# Patient Record
Sex: Male | Born: 1975
Health system: Southern US, Community
[De-identification: ages and names within clinical notes are randomized; demographics above are authoritative.]

## PROBLEM LIST (undated history)

## (undated) DIAGNOSIS — J3089 Other allergic rhinitis: Secondary | ICD-10-CM

## (undated) DIAGNOSIS — I10 Essential (primary) hypertension: Secondary | ICD-10-CM

## (undated) DIAGNOSIS — F32A Depression, unspecified: Secondary | ICD-10-CM

## (undated) DIAGNOSIS — F419 Anxiety disorder, unspecified: Secondary | ICD-10-CM

## (undated) HISTORY — PX: VASECTOMY: SHX75

---

## 2009-11-25 ENCOUNTER — Ambulatory Visit: Payer: Self-pay | Admitting: Gastroenterology

## 2013-10-10 ENCOUNTER — Ambulatory Visit: Payer: Self-pay | Admitting: General Practice

## 2013-11-13 ENCOUNTER — Ambulatory Visit: Payer: Self-pay | Admitting: Urology

## 2015-04-15 IMAGING — CT CT ABD-PELV W/ CM
2 of 7 series · 14 of 46 positions shown, 19 images · IV contrast (isovue)
Comparison: 10/10/2013

CLINICAL DATA: Gross hematuria.  No other symptoms.

EXAM:
CT ABDOMEN AND PELVIS WITH CONTRAST
TECHNIQUE: Multidetector CT imaging of the abdomen and pelvis was performed
using the standard protocol following bolus administration of
intravenous contrast.
CONTRAST:  125 cc Isovue 370

[Series 2: axial soft tissue · axial · 0.79mm/px · z∈[-967,-587]mm · 11 of 90 slices shown, 16 images]
[im 7/90  soft-tissue]
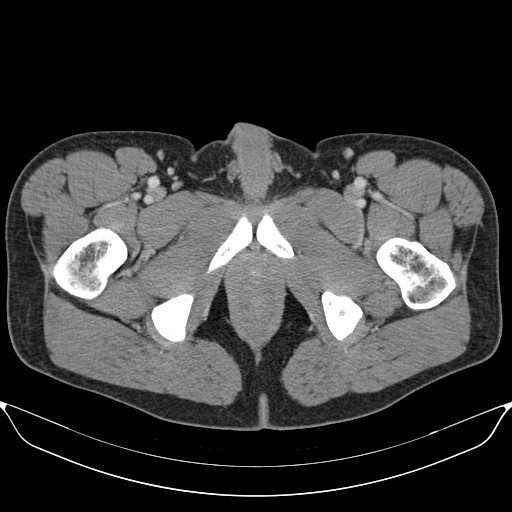
[im 7/90  bone]
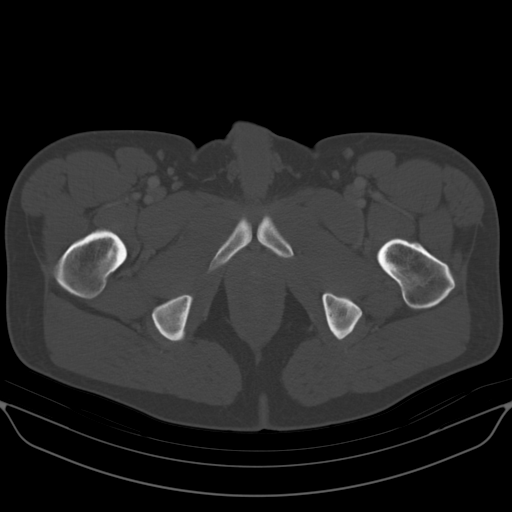
[im 13/90  soft-tissue]
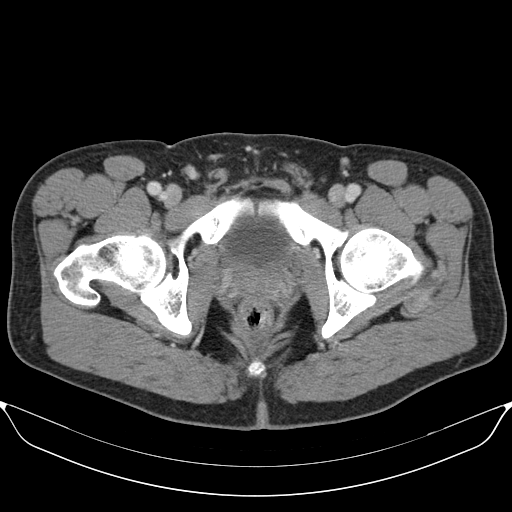
[im 26/90  soft-tissue]
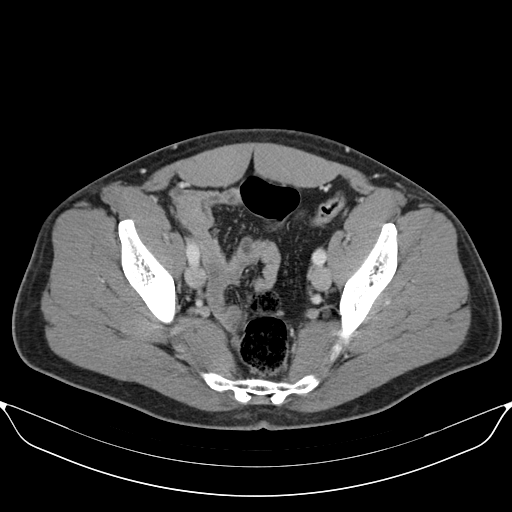
[im 32/90  soft-tissue]
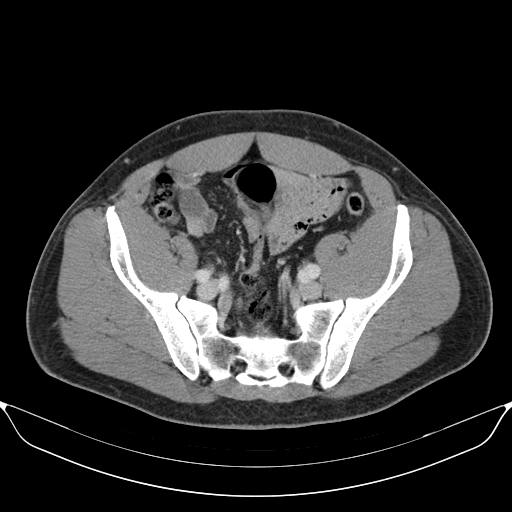
[im 39/90  soft-tissue]
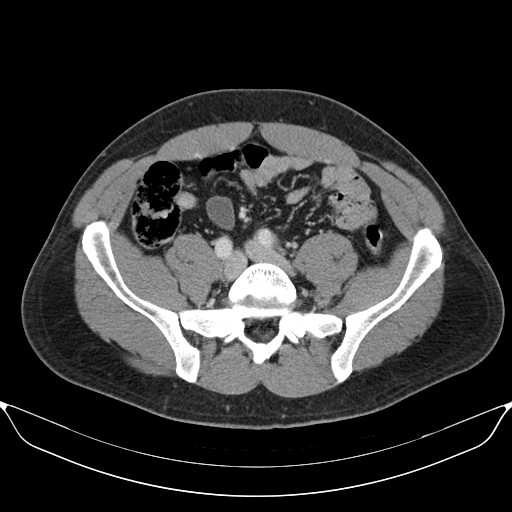
[im 51/90  soft-tissue]
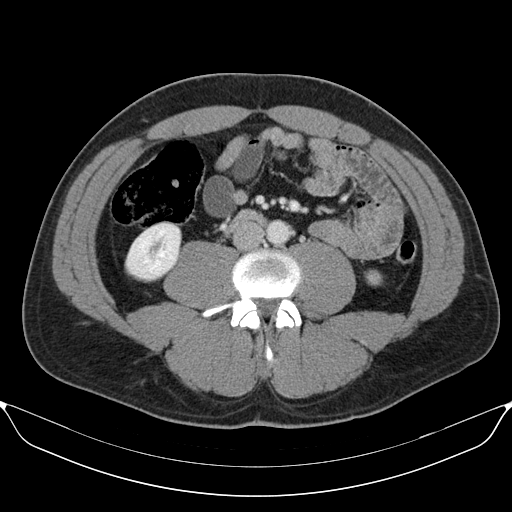
[im 58/90  soft-tissue]
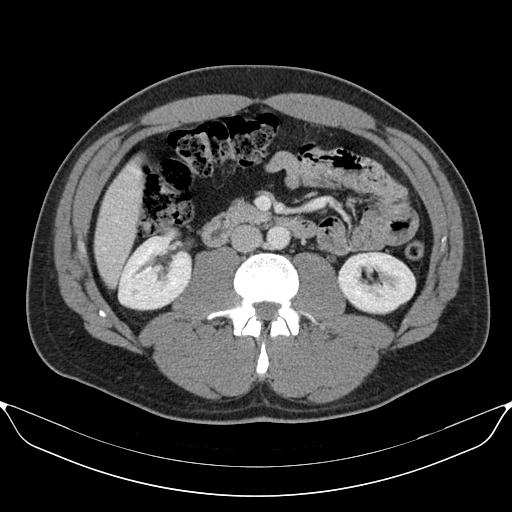
[im 64/90  soft-tissue]
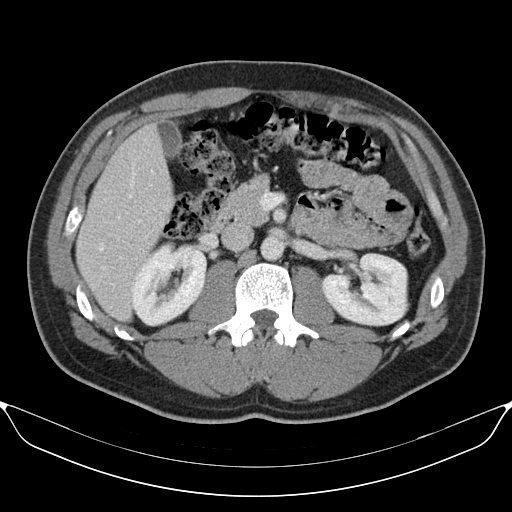
[im 64/90  lung]
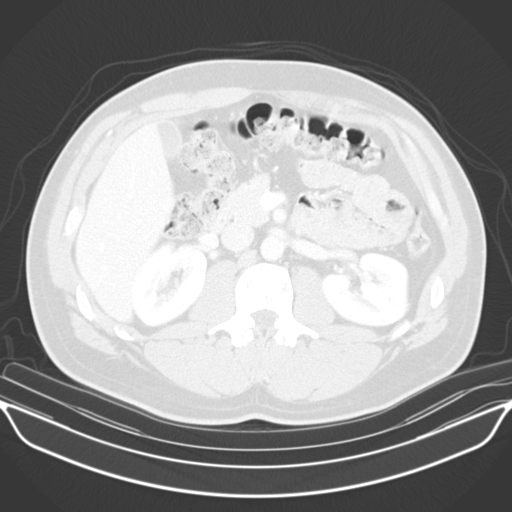
[im 70/90  lung]
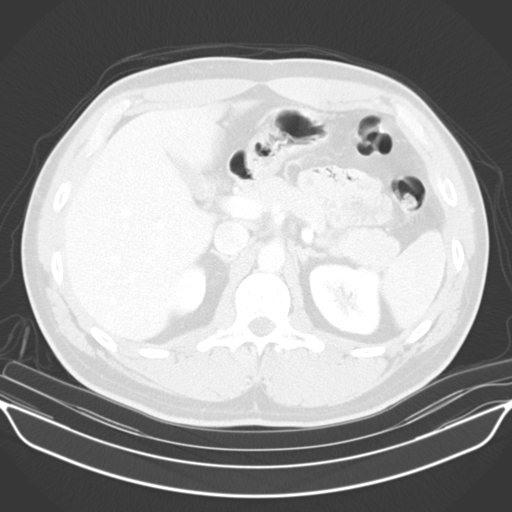
[im 77/90  soft-tissue]
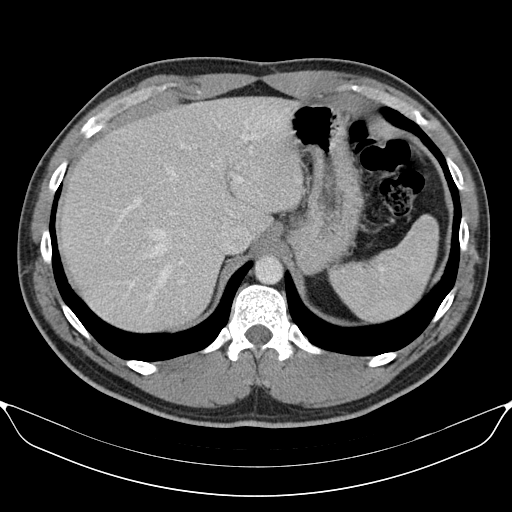
[im 77/90  lung]
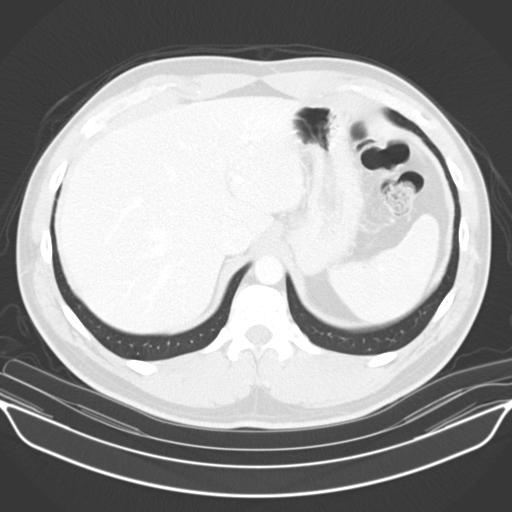
[im 77/90  bone]
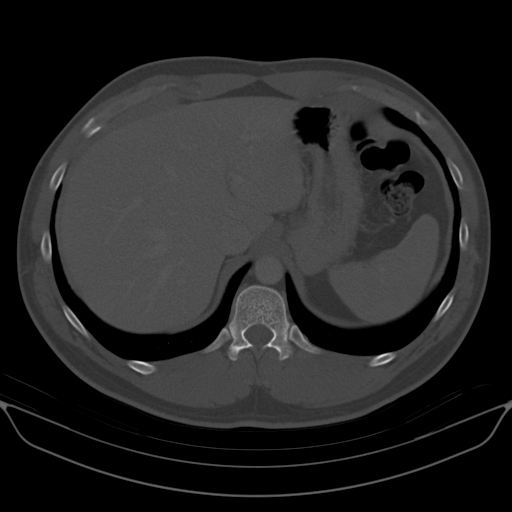
[im 83/90  soft-tissue]
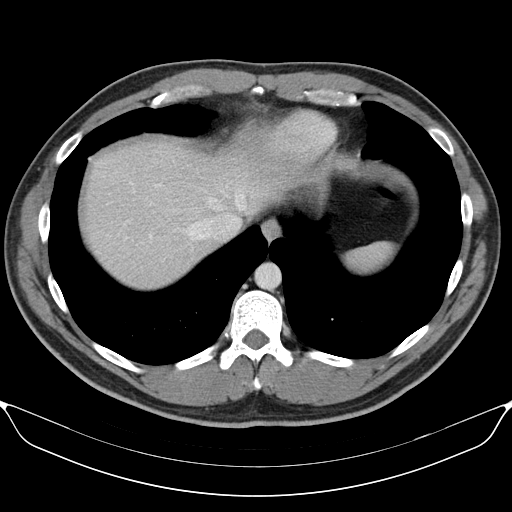
[im 83/90  lung]
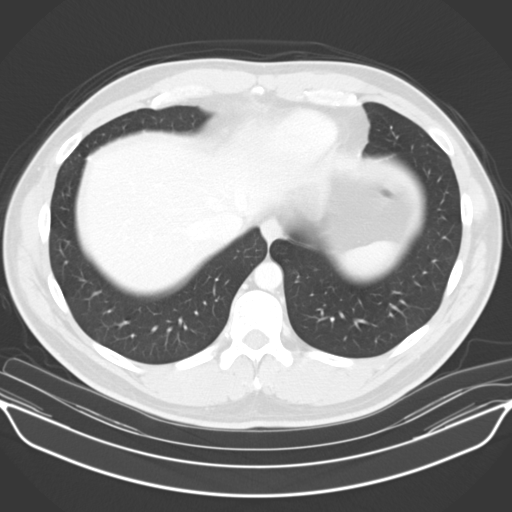

[Series 602: coronal · coronal · 0.87mm/px · 3 of 134 slices shown]
[im 34/134  soft-tissue]
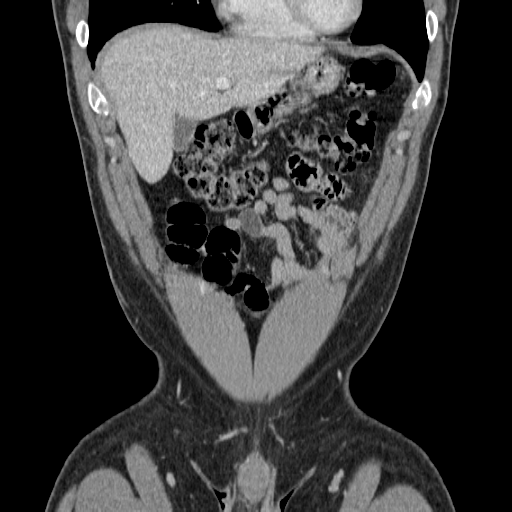
[im 67/134  soft-tissue]
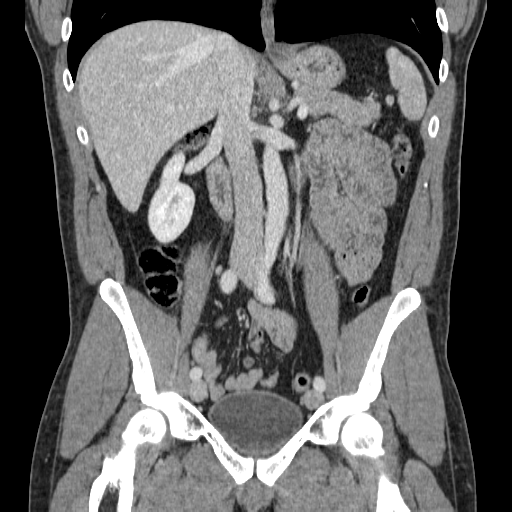
[im 100/134  soft-tissue]
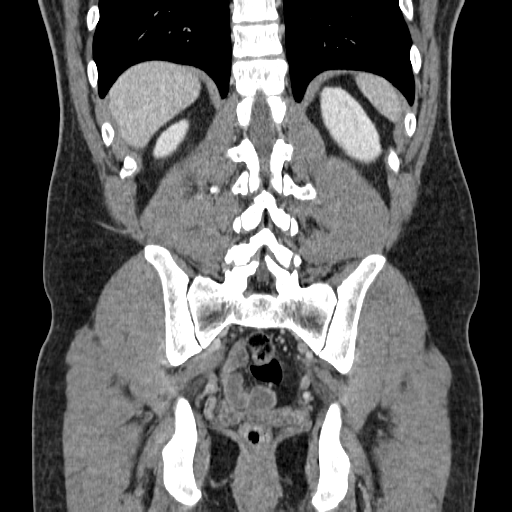

[14 of 46 positions shown; findings below may reference images not displayed]

FINDINGS: Lung Bases: Calcified granuloma at the right lung base. Normal heart
size without pericardial or pleural effusion.

Kidneys/Ureters/Bladder: No urinary tract calculi on last month's
stone study. No suspicious renal mass on post-contrast images. Renal
collecting systems moderately well opacified on delayed images.
Incomplete opacification of the distal most portion of the right
ureter. No filling defect identified.

No filling defect within moderately well opacified urinary bladder
on delayed images. No enhancing bladder mass on portal venous phase
images.

Other: Normal liver. Splenule. Normal stomach, pancreas,
gallbladder, biliary tract, adrenal glands. No retroperitoneal or
retrocrural adenopathy. Normal colon, appendix, and terminal ileum.
Normal small bowel without abdominal ascites. Early but age advanced
right internal iliac artery atherosclerosis on image 67/series 2 and
left internal iliac artery atherosclerosis on image 69/series 2. No
pelvic adenopathy. Normal prostate, without significant free pelvic
fluid. No significant free fluid.

Bones/Musculoskeletal: Suspect congenitally short lumbar pedicles.
No acute osseous abnormality.
IMPRESSION: 1.  No acute process or explanation for hematuria.
2. No unenhanced images performed. No evidence of urinary tract
calculi on the 10/10/2013 stone study.
3. Age advanced pelvic atherosclerosis.

## 2016-10-06 NOTE — Discharge Instructions (Signed)
[facility] °[facility] °ENDOSCOPIC SINUS SURGERY °[facility] ° °What is Functional Endoscopic Sinus Surgery? ° The Surgery involves making the natural openings of the sinuses larger by removing the bony partitions that separate the sinuses from the nasal cavity.  The natural sinus lining is preserved as much as possible to allow the sinuses to resume normal function after the surgery.  In some patients nasal polyps (excessively swollen lining of the sinuses) may be removed to relieve obstruction of the sinus openings.  The surgery is performed through the nose using lighted scopes, which eliminates the need for incisions on the face.  A septoplasty is a different procedure which is sometimes performed with sinus surgery.  It involves straightening the boy partition that separates the two sides of your nose.  A crooked or deviated septum may need repair if is obstructing the sinuses or nasal airflow.  Turbinate reduction is also often performed during sinus surgery.  The turbinates are bony proturberances from the side walls of the nose which swell and can obstruct the nose in patients with sinus and allergy problems.  Their size can be surgically reduced to help relieve nasal obstruction. ° °What Can Sinus Surgery Do For Me? ° Sinus surgery can reduce the frequency of sinus infections requiring antibiotic treatment.  This can provide improvement in nasal congestion, post-nasal drainage, facial pressure and nasal obstruction.  Surgery will NOT prevent you from ever having an infection again, so it usually only for patients who get infections 4 or more times yearly requiring antibiotics, or for infections that do not clear with antibiotics.  It will not cure nasal allergies, so patients with allergies may still require medication to treat their allergies after surgery. Surgery may improve headaches related to sinusitis, however, some people will continue to  require medication to control sinus headaches related to allergies.  Surgery will do nothing for other forms of headache (migraine, tension or cluster). ° °What Are the Risks of Endoscopic Sinus Surgery? ° Current techniques allow surgery to be performed safely with little risk, however, there are rare complications that patients should be aware of.  Because the sinuses are located around the eyes, there is risk of eye injury, including blindness, though again, this would be quite rare. This is usually a result of bleeding behind the eye during surgery, which puts the vision oat risk, though there are treatments to protect the vision and prevent permanent disrupted by surgery causing a leak of the spinal fluid that surrounds the brain.  More serious complications would include bleeding inside the brain cavity or damage to the brain.  Again, all of these complications are uncommon, and spinal fluid leaks can be safely managed surgically if they occur.  The most common complication of sinus surgery is bleeding from the nose, which may require packing or cauterization of the nose.  Continued sinus have polyps may experience recurrence of the polyps requiring revision surgery.  Alterations of sense of smell or injury to the tear ducts are also rare complications.  ° °What is the Surgery Like, and what is the Recovery? ° The Surgery usually takes a couple of hours to perform, and is usually performed under a general anesthetic (completely asleep).  Patients are usually discharged home after a couple of hours.  Sometimes during surgery it is necessary to pack the nose to control bleeding, and the packing is left in place for 24 - 48 hours, and removed by your surgeon.    If a septoplasty was performed during the procedure, there is often a splint placed which must be removed after 5-7 days.   °Discomfort: Pain is usually mild to moderate, and can be controlled by prescription pain medication or acetaminophen (Tylenol).   Aspirin, Ibuprofen (Advil, Motrin), or Naprosyn (Aleve) should be avoided, as they can cause increased bleeding.  Most patients feel sinus pressure like they have a bad head cold for several days.  Sleeping with your head elevated can help reduce swelling and facial pressure, as can ice packs over the face.  A humidifier may be helpful to keep the mucous and blood from drying in the nose.  ° °Diet: There are no specific diet restrictions, however, you should generally start with clear liquids and a light diet of bland foods because the anesthetic can cause some nausea.  Advance your diet depending on how your stomach feels.  Taking your pain medication with food will often help reduce stomach upset which pain medications can cause. ° °Nasal Saline Irrigation: It is important to remove blood clots and dried mucous from the nose as it is healing.  This is done by having you irrigate the nose at least 3 - 4 times daily with a salt water solution.  We recommend using NeilMed Sinus Rinse (available at the drug store).  Fill the squeeze bottle with the solution, bend over a sink, and insert the tip of the squeeze bottle into the nose ½ of an inch.  Point the tip of the squeeze bottle towards the inside corner of the eye on the same side your irrigating.  Squeeze the bottle and gently irrigate the nose.  If you bend forward as you do this, most of the fluid will flow back out of the nose, instead of down your throat.   The solution should be warm, near body temperature, when you irrigate.   Each time you irrigate, you should use a full squeeze bottle.  ° °Note that if you are instructed to use Nasal Steroid Sprays at any time after your surgery, irrigate with saline BEFORE using the steroid spray, so you do not wash it all out of the nose. °Another product, Nasal Saline Gel (such as AYR Nasal Saline Gel) can be applied in each nostril 3 - 4 times daily to moisture the nose and reduce scabbing or crusting. ° °Bleeding:   Bloody drainage from the nose can be expected for several days, and patients are instructed to irrigate their nose frequently with salt water to help remove mucous and blood clots.  The drainage may be dark red or brown, though some fresh blood may be seen intermittently, especially after irrigation.  Do not blow you nose, as bleeding may occur. If you must sneeze, keep your mouth open to allow air to escape through your mouth. ° °If heavy bleeding occurs: Irrigate the nose with saline to rinse out clots, then spray the nose 3 - 4 times with Afrin Nasal Decongestant Spray.  The spray will constrict the blood vessels to slow bleeding.  Pinch the lower half of your nose shut to apply pressure, and lay down with your head elevated.  Ice packs over the nose may help as well. If bleeding persists despite these measures, you should notify your doctor.  Do not use the Afrin routinely to control nasal congestion after surgery, as it can result in worsening congestion and may affect healing.  ° °Activity: Return to work varies among patients. Most patients will be out of   work at least 5 - 7 days to recover.  Patient may return to work after they are off of narcotic pain medication, and feeling well enough to perform the functions of their job.  Patients must avoid heavy lifting (over 10 pounds) or strenuous physical for 2 weeks after surgery, so your employer may need to assign you to light duty, or keep you out of work longer if light duty is not possible.  NOTE: you should not drive, operate dangerous machinery, do any mentally demanding tasks or make any important legal or financial decisions while on narcotic pain medication and recovering from the general anesthetic.  °  °Call Your Doctor Immediately if You Have Any of the Following: °1. Bleeding that you cannot control with the above measures °2. Loss of vision, double vision, bulging of the eye or black eyes. °3. Fever over 101 degrees °4. Neck stiffness with severe  headache, fever, nausea and change in mental state. °You are always encourage to call anytime with concerns, however, please call with requests for pain medication refills during office hours. ° °Office Endoscopy: During follow-up visits your doctor will remove any packing or splints that may have been placed and evaluate and clean your sinuses endoscopically.  Topical anesthetic will be used to make this as comfortable as possible, though you may want to take your pain medication prior to the visit.  How often this will need to be done varies from patient to patient.  After complete recovery from the surgery, you may need follow-up endoscopy from time to time, particularly if there is concern of recurrent infection or nasal polyps. ° ° °General Anesthesia, Adult, Care After °These instructions provide you with information about caring for yourself after your procedure. Your health care provider may also give you more specific instructions. Your treatment has been planned according to current medical practices, but problems sometimes occur. Call your health care provider if you have any problems or questions after your procedure. °What can I expect after the procedure? °After the procedure, it is common to have: °· Vomiting. °· A sore throat. °· Mental slowness. °It is common to feel: °· Nauseous. °· Cold or shivery. °· Sleepy. °· Tired. °· Sore or achy, even in parts of your body where you did not have surgery. °Follow these instructions at home: °For at least 24 hours after the procedure: °· Do not: °¨ Participate in activities where you could fall or become injured. °¨ Drive. °¨ Use heavy machinery. °¨ Drink alcohol. °¨ Take sleeping pills or medicines that cause drowsiness. °¨ Make important decisions or sign legal documents. °¨ Take care of children on your own. °· Rest. °Eating and drinking °· If you vomit, drink water, juice, or soup when you can drink without vomiting. °· Drink enough fluid to keep your  urine clear or pale yellow. °· Make sure you have little or no nausea before eating solid foods. °· Follow the diet recommended by your health care provider. °General instructions °· Have a responsible adult stay with you until you are awake and alert. °· Return to your normal activities as told by your health care provider. Ask your health care provider what activities are safe for you. °· Take over-the-counter and prescription medicines only as told by your health care provider. °· If you smoke, do not smoke without supervision. °· Keep all follow-up visits as told by your health care provider. This is important. °Contact a health care provider if: °· You continue to have nausea   or vomiting at home, and medicines are not helpful. °· You cannot drink fluids or start eating again. °· You cannot urinate after 8-12 hours. °· You develop a skin rash. °· You have fever. °· You have increasing redness at the site of your procedure. °Get help right away if: °· You have difficulty breathing. °· You have chest pain. °· You have unexpected bleeding. °· You feel that you are having a life-threatening or urgent problem. °This information is not intended to replace advice given to you by your health care provider. Make sure you discuss any questions you have with your health care provider. °Document Released: 01/30/2001 Document Revised: 03/28/2016 Document Reviewed: 10/08/2015 °Elsevier Interactive Patient Education © 2017 [employer] ° °

## 2016-10-13 ENCOUNTER — Ambulatory Visit
Admission: RE | Admit: 2016-10-13 | Discharge: 2016-10-13 | Disposition: A | Discharge status: Home / Self Care | Payer: Commercial Managed Care - HMO | Source: Ambulatory Visit | Attending: Otolaryngology | Admitting: Otolaryngology

## 2016-10-13 ENCOUNTER — Ambulatory Visit: Payer: Commercial Managed Care - HMO | Admitting: Anesthesiology

## 2016-10-13 ENCOUNTER — Encounter
Admission: RE | Disposition: A | Discharge status: Home / Self Care | Payer: Self-pay | Source: Ambulatory Visit | Attending: Otolaryngology

## 2016-10-13 DIAGNOSIS — J343 Hypertrophy of nasal turbinates: Secondary | ICD-10-CM | POA: Insufficient documentation

## 2016-10-13 DIAGNOSIS — I10 Essential (primary) hypertension: Secondary | ICD-10-CM | POA: Diagnosis not present

## 2016-10-13 DIAGNOSIS — J342 Deviated nasal septum: Secondary | ICD-10-CM | POA: Diagnosis not present

## 2016-10-13 HISTORY — PX: SEPTOPLASTY: SHX2393

## 2016-10-13 HISTORY — DX: Essential (primary) hypertension: I10

## 2016-10-13 HISTORY — PX: TURBINATE REDUCTION: SHX6157

## 2016-10-13 SURGERY — SEPTOPLASTY, NOSE
Anesthesia: General | Site: Nose | Laterality: Bilateral | Wound class: Clean Contaminated

## 2016-10-13 MED ORDER — OXYCODONE HCL 5 MG/5ML PO SOLN: 5.0000 mg | Freq: Once | ORAL | Status: AC | PRN

## 2016-10-13 MED ORDER — LACTATED RINGERS IV SOLN
INTRAVENOUS | Status: DC
  Administered 2016-10-13: 10:00:00 via INTRAVENOUS

## 2016-10-13 MED ORDER — FENTANYL CITRATE (PF) 100 MCG/2ML IJ SOLN
INTRAMUSCULAR | Status: DC | PRN
  Administered 2016-10-13: 100 ug via INTRAVENOUS

## 2016-10-13 MED ORDER — LIDOCAINE HCL (CARDIAC) 20 MG/ML IV SOLN
INTRAVENOUS | Status: DC | PRN
  Administered 2016-10-13: 50 mg via INTRAVENOUS

## 2016-10-13 MED ORDER — ALBUTEROL SULFATE HFA 108 (90 BASE) MCG/ACT IN AERS
INHALATION_SPRAY | RESPIRATORY_TRACT | Status: DC | PRN
  Administered 2016-10-13 (×3): 2 via RESPIRATORY_TRACT

## 2016-10-13 MED ORDER — LIDOCAINE-EPINEPHRINE 1 %-1:100000 IJ SOLN
INTRAMUSCULAR | Status: DC | PRN
  Administered 2016-10-13: 6 mL

## 2016-10-13 MED ORDER — SCOPOLAMINE 1 MG/3DAYS TD PT72
1.0000 | MEDICATED_PATCH | Freq: Once | TRANSDERMAL | Status: DC
  Administered 2016-10-13: 1.5 mg via TRANSDERMAL

## 2016-10-13 MED ORDER — ONDANSETRON HCL 4 MG/2ML IJ SOLN: 4.0000 mg | Freq: Once | INTRAMUSCULAR | Status: DC | PRN

## 2016-10-13 MED ORDER — CEFAZOLIN SODIUM-DEXTROSE 2-4 GM/100ML-% IV SOLN
2.0000 g | Freq: Once | INTRAVENOUS | Status: AC
  Administered 2016-10-13: 2 g via INTRAVENOUS

## 2016-10-13 MED ORDER — FENTANYL CITRATE (PF) 100 MCG/2ML IJ SOLN
25.0000 ug | INTRAMUSCULAR | Status: AC | PRN
  Administered 2016-10-13: 50 ug via INTRAVENOUS
  Administered 2016-10-13 (×2): 25 ug via INTRAVENOUS
  Administered 2016-10-13: 50 ug via INTRAVENOUS

## 2016-10-13 MED ORDER — ONDANSETRON HCL 4 MG/2ML IJ SOLN
INTRAMUSCULAR | Status: DC | PRN
  Administered 2016-10-13: 4 mg via INTRAVENOUS

## 2016-10-13 MED ORDER — SUCCINYLCHOLINE CHLORIDE 20 MG/ML IJ SOLN
INTRAMUSCULAR | Status: DC | PRN
  Administered 2016-10-13: 100 mg via INTRAVENOUS

## 2016-10-13 MED ORDER — ACETAMINOPHEN 10 MG/ML IV SOLN
1000.0000 mg | Freq: Once | INTRAVENOUS | Status: AC
  Administered 2016-10-13: 1000 mg via INTRAVENOUS

## 2016-10-13 MED ORDER — DEXAMETHASONE SODIUM PHOSPHATE 4 MG/ML IJ SOLN
INTRAMUSCULAR | Status: DC | PRN
  Administered 2016-10-13: 12 mg via INTRAVENOUS

## 2016-10-13 MED ORDER — PROPOFOL 10 MG/ML IV BOLUS
INTRAVENOUS | Status: DC | PRN
  Administered 2016-10-13: 200 mg via INTRAVENOUS

## 2016-10-13 MED ORDER — OXYMETAZOLINE HCL 0.05 % NA SOLN
2.0000 | Freq: Once | NASAL | Status: AC
  Administered 2016-10-13: 2 via NASAL

## 2016-10-13 MED ORDER — OXYCODONE HCL 5 MG PO TABS
5.0000 mg | ORAL_TABLET | Freq: Once | ORAL | Status: AC | PRN
  Administered 2016-10-13: 5 mg via ORAL

## 2016-10-13 MED ORDER — MIDAZOLAM HCL 5 MG/5ML IJ SOLN
INTRAMUSCULAR | Status: DC | PRN
  Administered 2016-10-13: 2 mg via INTRAVENOUS

## 2016-10-13 MED ORDER — ROCURONIUM BROMIDE 100 MG/10ML IV SOLN
INTRAVENOUS | Status: DC | PRN
  Administered 2016-10-13: 20 mg via INTRAVENOUS

## 2016-10-13 MED ORDER — PHENYLEPHRINE HCL 0.5 % NA SOLN
NASAL | Status: DC | PRN
  Administered 2016-10-13: 30 mL via TOPICAL

## 2016-10-13 SURGICAL SUPPLY — 22 items
CANISTER SUCT 1200ML W/VALVE (MISCELLANEOUS) ×3 IMPLANT
CATH IV 18X1 1/4 SAFELET (CATHETERS) ×3 IMPLANT
COAGULATOR SUCT 8FR VV (MISCELLANEOUS) ×3 IMPLANT
DRAPE HEAD BAR (DRAPES) ×3 IMPLANT
GLOVE PI ULTRA LF STRL 7.5 (GLOVE) ×3 IMPLANT
GLOVE PI ULTRA NON LATEX 7.5 (GLOVE) ×6
IV CATH 18X1 1/4 SAFELET (CATHETERS) ×1
KIT ROOM TURNOVER OR (KITS) ×3 IMPLANT
NEEDLE ANESTHESIA  27G X 3.5 (NEEDLE)
NEEDLE ANESTHESIA 27G X 3.5 (NEEDLE) IMPLANT
PACK DRAPE NASAL/ENT (PACKS) ×3 IMPLANT
PAD GROUND ADULT SPLIT (MISCELLANEOUS) ×3 IMPLANT
PATTIES SURGICAL .5 X3 (DISPOSABLE) ×3 IMPLANT
SPLINT NASAL SEPTAL BLV .50 ST (MISCELLANEOUS) ×3 IMPLANT
STRAP BODY AND KNEE 60X3 (MISCELLANEOUS) ×6 IMPLANT
SUT CHROMIC 3-0 (SUTURE) ×2
SUT CHROMIC 3-0 KS 27XMFL CR (SUTURE) ×1
SUT ETHILON 3-0 KS 30 BLK (SUTURE) ×3 IMPLANT
SUT PLAIN GUT 4-0 (SUTURE) ×3 IMPLANT
SUTURE CHRMC 3-0 KS 27XMFL CR (SUTURE) ×1 IMPLANT
SYR 3ML LL SCALE MARK (SYRINGE) ×3 IMPLANT
TOWEL OR 17X26 4PK STRL BLUE (TOWEL DISPOSABLE) ×3 IMPLANT

## 2016-10-13 NOTE — Transfer of Care (Signed)
Immediate Anesthesia Transfer of Care Note  Patient: Julian ArenasJason S Wessels  Procedure(s) Performed: Procedure(s): SEPTOPLASTY (Bilateral) BILATERAL INFERIOR TURBINATE REDUCTION AND RIGHT MIDDLE (Bilateral)  Patient Location: PACU  Anesthesia Type: General ETT  Level of Consciousness: awake, alert  and patient cooperative  Airway and Oxygen Therapy: Patient Spontanous Breathing and Patient connected to supplemental oxygen  Post-op Assessment: Post-op Vital signs reviewed, Patient's Cardiovascular Status Stable, Respiratory Function Stable, Patent Airway and No signs of Nausea or vomiting  Post-op Vital Signs: Reviewed and stable  Complications: No apparent anesthesia complications

## 2016-10-13 NOTE — Op Note (Signed)
10/13/2016  12:22 PM [id number]   Pre-Op Dx:  Deviated Nasal Septum, Hypertrophic Inferior Turbinates, hypertrophic right middle turbinate, lateralized left middle turbinate  Post-op Dx: Same  Proc: Nasal Septoplasty, Bilateral Partial Reduction Inferior Turbinates, endoscopic reduction of right middle turbinate   Surg:  Julian Murphy  Anes:  GOT  EBL:  100 mL  Comp:  None  Findings: The septum had a Z-shaped deformity with the ethmoid plate buckled to the left side and pushing the middle turbinate laterally. The septum had a spur inferiorly to the right side with cartilage overhanging here. The right middle turbinate was overgrown and the inferior turbinates were hypertrophied  Procedure: With the patient in a comfortable supine position,  general orotracheal anesthesia was induced without difficulty.     The patient received preoperative Afrin spray for topical decongestion and vasoconstriction.  Intravenous prophylactic antibiotics were administered.  At an appropriate level, the patient was placed in a semi-sitting position.  Nasal vibrissae were trimmed.   1% Xylocaine with 1:100,000 epinephrine, 6 cc's, was infiltrated into the anterior floor of the nose, into the nasal spine region, into the membranous columella, and finally into the submucoperichondrial plane of the septum on both sides.  Several minutes were allowed for this to take effect.  Cottoniod pledgetts soaked in Afrin and 4% Xylocaine were placed into both nasal cavities and left while the patient was prepped and draped in the standard fashion.  The materials were removed from the nose and observed to be intact and correct in number.  The nose was inspected with a headlight and a 0 scope with the findings as described above.  A left Killian incision was sharply executed and carried down to the quadrangular cartilage. The mucoperichondrium was elelvated along the quadrangular plate back to the bony-cartilaginous  junction. The mucoperiostium was then elevated along the ethmoid plate and the vomer. The boney-catilaginous junction was then split with a freer elevator and the mucoperiosteum was elevated on the opposite side. The mucoperiosteum was then elevated along the maxillary crest as needed to expose the crooked bone of the crest.  Boney spurs of the vomer and maxillary crest were removed with Lenoria Chimeakahashi forceps.  The cartilaginous plate was trimmed along its posterior and inferior borders of about 2 mm of cartilage to free it up inferiorly. Some of the deviated ethmoid plate was then fractured and removed with Takahashi forceps to free up the posterior border of the quadrangular plate and allow it to swing back to the midline. The mucosal flaps were placed back into their anatomic position to allow visualization of the airways. The septum now sat in the midline with an improved airway.  A 3-0 Chromic suture on a Keith needle in used to anchor the inferior septum at the nasal spine with a through and through suture. The mucosal flaps are then sutured together using a through and through whip stitch of 4-0 Plain Gut with a mini-Keith needle. This was used to close the DoughertyKillian incision as well.   The inferior turbinates were then inspected. An incision was created along the inferior aspect of the left inferior turbinate with removal of some of the inferior soft tissue and bone. Electrocautery was used to control bleeding in the area. The remaining turbinate was then outfractured to open up the airway further. There was no significant bleeding noted. The right turbinate was then trimmed and outfractured in a similar fashion.  The left middle turbinate was lateralized and had a very small middle meatus. It  was fractured medially and a piece of xerogel was placed into the middle meatus to help hold this opened and and in its normal medial position. The right middle turbinate was enlarged and 4 medialized pushing up  against the superior septum now that the septum was back in the middle. It was trimmed along its anterior inferior border with the Gruenwald forceps which removed some of this excessive turbinate. Electrocautery was used along its trimmed edge to help control bleeding. Xerogel was placed over the trimmed middle turbinate as a dressing.  The airways were then visualized and showed open passageways on both sides that were significantly improved compared to before surgery. There was no signifcant bleeding. Nasal splints were applied to both sides of the septum using Xomed 0.265mm regular sized splints that were trimmed, and then held in position with a 3-0 Nylon through and through suture.  The patient was turned back over to anesthesia, and awakened, extubated, and taken to the PACU in satisfactory condition.  Dispo:   PACU to home  Plan: Ice, elevation, narcotic analgesia, steroid taper, and prophylactic antibiotics for the duration of indwelling nasal foreign bodies.  We will reevaluate the patient in the office in 6 days and remove the septal splints.  Return to work in 10 days, strenuous activities in two weeks.   Julian Murphy 10/13/2016 12:22 PM

## 2016-10-13 NOTE — Anesthesia Preprocedure Evaluation (Signed)
Anesthesia Evaluation  Patient identified by MRN, date of birth, ID band Patient awake    Reviewed: Allergy & Precautions, H&P , NPO status , Patient's Chart, lab work & pertinent test results  Airway Mallampati: II  TM Distance: >3 FB Neck ROM: full    Dental no notable dental hx.    Pulmonary    Pulmonary exam normal        Cardiovascular hypertension, Normal cardiovascular exam     Neuro/Psych    GI/Hepatic   Endo/Other    Renal/GU      Musculoskeletal   Abdominal   Peds  Hematology   Anesthesia Other Findings   Reproductive/Obstetrics                             Anesthesia Physical Anesthesia Plan  ASA: II  Anesthesia Plan: General ETT   Post-op Pain Management:    Induction:   Airway Management Planned:   Additional Equipment:   Intra-op Plan:   Post-operative Plan:   Informed Consent: I have reviewed the patients History and Physical, chart, labs and discussed the procedure including the risks, benefits and alternatives for the proposed anesthesia with the patient or authorized representative who has indicated his/her understanding and acceptance.     Plan Discussed with:   Anesthesia Plan Comments:         Anesthesia Quick Evaluation

## 2016-10-13 NOTE — Anesthesia Procedure Notes (Signed)
Procedure Name: Intubation Date/Time: 10/13/2016 11:04 AM Performed by: Andee PolesBUSH, Olsen Mccutchan Pre-anesthesia Checklist: Patient identified, Emergency Drugs available, Suction available, Patient being monitored and Timeout performed Patient Re-evaluated:Patient Re-evaluated prior to inductionOxygen Delivery Method: Circle system utilized Preoxygenation: Pre-oxygenation with 100% oxygen Intubation Type: IV induction Ventilation: Mask ventilation without difficulty Laryngoscope Size: Mac and 4 Grade View: Grade II Tube type: Oral Rae Tube size: 8.0 mm Number of attempts: 1 Placement Confirmation: ETT inserted through vocal cords under direct vision,  positive ETCO2 and breath sounds checked- equal and bilateral Tube secured with: Tape Dental Injury: Teeth and Oropharynx as per pre-operative assessment

## 2016-10-13 NOTE — H&P (Signed)
  H&P has been reviewed and no changes necessary. To be downloaded later. 

## 2016-10-13 NOTE — Anesthesia Postprocedure Evaluation (Signed)
Anesthesia Post Note  Patient: Julian Murphy  Procedure(s) Performed: Procedure(s) (LRB): SEPTOPLASTY (Bilateral) BILATERAL INFERIOR TURBINATE REDUCTION AND RIGHT MIDDLE (Bilateral)  Patient location during evaluation: PACU Anesthesia Type: General Level of consciousness: awake and alert and oriented Pain management: satisfactory to patient Vital Signs Assessment: post-procedure vital signs reviewed and stable Respiratory status: spontaneous breathing, nonlabored ventilation and respiratory function stable Cardiovascular status: blood pressure returned to baseline and stable Postop Assessment: Adequate PO intake and No signs of nausea or vomiting Anesthetic complications: no    Cherly BeachStella, Justo Hengel J

## 2016-10-14 ENCOUNTER — Encounter: Payer: Self-pay | Admitting: Otolaryngology

## 2017-05-02 ENCOUNTER — Ambulatory Visit
Admission: EM | Admit: 2017-05-02 | Discharge: 2017-05-02 | Disposition: A | Discharge status: Home / Self Care | Payer: 59 | Attending: Emergency Medicine | Admitting: Emergency Medicine

## 2017-05-02 DIAGNOSIS — S30860A Insect bite (nonvenomous) of lower back and pelvis, initial encounter: Secondary | ICD-10-CM | POA: Diagnosis not present

## 2017-05-02 DIAGNOSIS — L0291 Cutaneous abscess, unspecified: Secondary | ICD-10-CM | POA: Diagnosis not present

## 2017-05-02 MED ORDER — SULFAMETHOXAZOLE-TRIMETHOPRIM 800-160 MG PO TABS: 1.0000 | ORAL_TABLET | Freq: Two times a day (BID) | ORAL | 0 refills | Status: AC

## 2017-05-02 NOTE — ED Triage Notes (Signed)
Patient complains of left back pain that occurred from possible spider bite. Area looks like an abscess. Patient states that this started last week.

## 2017-05-02 NOTE — ED Provider Notes (Signed)
[facility] ____________________________________________  Time seen: Approximately 10:43 AM  I have reviewed the triage vital signs and the nursing notes.   HISTORY  Chief Complaint Insect Bite   HPI Julian Murphy is a 41 y.o. male presenting for evaluation of insect bite that he believes occurred over this past week. Patient reports initially he had a small red spot on his left back that was itchy and look like a typical insect bite but has gradually worsened and is now tender. States mildly tender. Denies any drainage. Denies any headaches, joint pain, fevers, weakness, nausea, other complaints. Patient reports he believes he had a sebaceous cyst right side where he was bitten by the insect. Patient denies any other skin changes. Denies known tick bite or tick attachments. 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 Continues to eat and drink well. Denies other complaints. No aggravating or alleviating factors. Has not tried any over-the-counter medications. Denies other skin changes.  Denies chest pain, shortness of breath, abdominal pain, dysuria, extremity pain, extremity swelling.. Denies recent sickness. Denies recent antibiotic use.   PCP: [name] Reports tetanus immunization is up to date.     Past Medical History:  Diagnosis Date  . Hypertension    "under control now"    There are no active problems to display for this patient.   Past Surgical History:  Procedure Laterality Date  . SEPTOPLASTY Bilateral 10/13/2016   Procedure: SEPTOPLASTY;  Surgeon: Vernie MurdersPaul Juengel, MD;  [facility];  Service: ENT;  Laterality: Bilateral;  . TURBINATE REDUCTION Bilateral 10/13/2016   Procedure: BILATERAL INFERIOR TURBINATE REDUCTION AND RIGHT MIDDLE;  Surgeon: Vernie MurdersPaul Juengel, MD;  [facility];  Service: ENT;  Laterality: Bilateral;     No current facility-administered medications for this  encounter.   Current Outpatient Prescriptions:  .  buPROPion (WELLBUTRIN XL) 300 MG 24 hr tablet, Take 300 mg by mouth daily., Disp: , Rfl:  .  sulfamethoxazole-trimethoprim (BACTRIM DS,SEPTRA DS) 800-160 MG tablet, Take 1 tablet by mouth 2 (two) times daily., Disp: 20 tablet, Rfl: 0  Allergies Patient has no known allergies.  History reviewed. No pertinent family history.  Social History Social History  Substance Use Topics  . Smoking status: Never Smoker  . Smokeless tobacco: Never Used  . Alcohol use No    Review of Systems Constitutional: No fever/chills Eyes: No visual changes. ENT: No sore throat. Cardiovascular: Denies chest pain. Respiratory: Denies shortness of breath. Gastrointestinal: No abdominal pain.  No nausea, no vomiting.  No diarrhea.  No constipation. Genitourinary: Negative for dysuria. Musculoskeletal: Negative for back pain. Skin: as above.   ____________________________________________   PHYSICAL EXAM:  VITAL SIGNS: ED Triage Vitals  Enc Vitals Group     BP 05/02/17 1007 (!) 144/93     Pulse Rate 05/02/17 1007 (!) 53     Resp 05/02/17 1007 17     Temp 05/02/17 1007 98.4 F (36.9 C)     Temp Source 05/02/17 1007 Oral     SpO2 05/02/17 1007 100 %     Weight 05/02/17 1006 240 lb (108.9 kg)     Height 05/02/17 1006 6\' 1"  (1.854 m)     Head Circumference --      Peak Flow --      Pain Score 05/02/17 1006 8     Pain Loc --      Pain Edu? --      Excl. in  GC? --     Constitutional: Alert and oriented. Well appearing and in no acute distress. Cardiovascular: Normal rate, regular rhythm. Grossly normal heart sounds.  Good peripheral circulation. Respiratory: Normal respiratory effort without tachypnea nor retractions. Breath sounds are clear and equal bilaterally. No wheezes, rales, rhonchi. Gastrointestinal: Soft and nontender. Musculoskeletal:   No midline cervical, thoracic or lumbar tenderness to palpation.  Neurologic:  Normal speech and  language.  Speech is normal. No gait instability.  Skin:  Skin is warm, dry. Except: Left posterior back along her left shoulder blade area of approximately 3.5 cm x 2.5 cm mildly tender, erythematous and indurated area, no drainage, no fluctuance, no pointing abscess, no further surrounding erythema, no foreign body visualized, no clear punctum, no surrounding tenderness. No midline tenderness. Psychiatric: Mood and affect are normal. Speech and behavior are normal. Patient exhibits appropriate insight and judgment   ___________________________________________   LABS (all labs ordered are listed, but only abnormal results are displayed)  Labs Reviewed  ROCKY MTN SPOTTED FVR ABS PNL(IGG+IGM)  B. BURGDORFI ANTIBODIES    RADIOLOGY  No results found. ____________________________________________   PROCEDURES Procedures     INITIAL IMPRESSION / ASSESSMENT AND PLAN / ED COURSE  Pertinent labs & imaging results that were available during my care of the patient were reviewed by me and considered in my medical decision making (see chart for details).  Very well-appearing patient. No acute distress. Presents for evaluation of left posterior back abscess. Area indurated with local erythema, no fluctuance, no pointing no active drainage. Discussed with patient attempted I&D versus empiric antibiotics, patient declined I&D at this time. 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 Will start patient on oral Bactrim and encouraged wound care and warm compresses. Will evaluate RMSF and Lyme labs, deferred Doxy at this time as patient is outside a lot. Discussed with patient appears may be a infected sebaceous cyst and may need to follow-up with surgery for sac removal, information given. Patient states that he had been meaning to see a surgeon any ways about another sebaceous cyst. Discussed strict follow-up and return parameters including area gets larger, draining, new or worsening  concerns.Discussed indication, risks and benefits of medications with patient.  Discussed follow up with Primary care physician this week. Discussed follow up and return parameters including no resolution or any worsening concerns. Patient verbalized understanding and agreed to plan.   ____________________________________________   FINAL CLINICAL IMPRESSION(S) / ED DIAGNOSES  Final diagnoses:  Abscess     New Prescriptions   SULFAMETHOXAZOLE-TRIMETHOPRIM (BACTRIM DS,SEPTRA DS) 800-160 MG TABLET    Take 1 tablet by mouth 2 (two) times daily.    Note: This dictation was prepared with Dragon dictation along with smaller phrase technology. Any transcriptional errors that result from this process are unintentional.         Renford Dills, NP 05/02/17 1106    Renford Dills, NP 05/02/17 [phone]

## 2017-05-02 NOTE — Discharge Instructions (Signed)
Take medication as prescribed. Rest. Drink plenty of fluids. Keep clean. Apply warm compresses.   Follow up with your primary care physician or the above, this week as discussed. Return to Urgent care for new or worsening concerns.

## 2017-05-03 LAB — B. BURGDORFI ANTIBODIES

## 2017-05-04 LAB — ROCKY MTN SPOTTED FVR ABS PNL(IGG+IGM)
RMSF IGG: POSITIVE — AB
RMSF IGM: 0.44 {index} (ref 0.00–0.89)

## 2017-05-04 LAB — RMSF, IGG, IFA

## 2017-05-05 ENCOUNTER — Encounter: Payer: Self-pay | Admitting: Emergency Medicine

## 2017-05-05 ENCOUNTER — Telehealth: Payer: Self-pay | Admitting: Emergency Medicine

## 2017-05-05 ENCOUNTER — Ambulatory Visit
Admission: EM | Admit: 2017-05-05 | Discharge: 2017-05-05 | Disposition: A | Discharge status: Home / Self Care | Payer: 59 | Attending: Family Medicine | Admitting: Family Medicine

## 2017-05-05 DIAGNOSIS — W57XXXD Bitten or stung by nonvenomous insect and other nonvenomous arthropods, subsequent encounter: Secondary | ICD-10-CM | POA: Diagnosis not present

## 2017-05-05 DIAGNOSIS — L0390 Cellulitis, unspecified: Secondary | ICD-10-CM | POA: Diagnosis not present

## 2017-05-05 DIAGNOSIS — S40262A Insect bite (nonvenomous) of left shoulder, initial encounter: Secondary | ICD-10-CM | POA: Diagnosis not present

## 2017-05-05 MED ORDER — DOXYCYCLINE HYCLATE 100 MG PO CAPS: 100.0000 mg | ORAL_CAPSULE | Freq: Two times a day (BID) | ORAL | 0 refills | Status: DC

## 2017-05-05 NOTE — ED Triage Notes (Signed)
Patient c/o insect bite for over a week.  Patient reports increase in redness and swelling.

## 2017-05-05 NOTE — Telephone Encounter (Signed)
RMSF test came back positive.  Patient notified of results. Dr. Thurmond ButtsWade would like Doxycycline 100mg  1 tablet twice a day for 10 days no refills #20 be sent to his pharmacy.  Patient informed to stop the Septra and start the Doxcycyline and f/u with PCP in 3-4 weeks per Dr. Thurmond ButtsWade.  Patient verbalized understanding.  Prescription sent to [facility] in [place].

## 2017-05-05 NOTE — ED Provider Notes (Signed)
[facility]    CSN: [id number] Arrival date & time: 05/05/17  1357     History   Chief Complaint Chief Complaint  Patient presents with  . Insect Bite    HPI Julian Murphy is a 41 y.o. male.   Patient is a 41 year old white male who was tick about a week ago. He was seen here about 2 days ago because of the site of the tick bite being swollen and red. That time he was placed on Septra tick bite site has gotten bigger redder more swollen more inflamed. He was also found to have positive from RMSF and doxycycline was called in today but he has not had chest PICC that up yet. No fever. He has hypertension which is under control. Only surgeries is turbinate reduction and septoplasty. No pertinent family medical history he's never spoke. No known drug allergies no pertinent family medical history.   The history is provided by the patient. No language interpreter was used.    Past Medical History:  Diagnosis Date  . Hypertension    "under control now"    There are no active problems to display for this patient.   Past Surgical History:  Procedure Laterality Date  . SEPTOPLASTY Bilateral 10/13/2016   Procedure: SEPTOPLASTY;  Surgeon: Vernie Murders, MD;  [facility];  Service: ENT;  Laterality: Bilateral;  . TURBINATE REDUCTION Bilateral 10/13/2016   Procedure: BILATERAL INFERIOR TURBINATE REDUCTION AND RIGHT MIDDLE;  Surgeon: Vernie Murders, MD;  Location: [facility];  Service: ENT;  Laterality: Bilateral;       Home Medications    Prior to Admission medications   Medication Sig Start Date End Date Taking? Authorizing Provider  buPROPion (WELLBUTRIN XL) 300 MG 24 hr tablet Take 300 mg by mouth daily.    [provider]  doxycycline (VIBRAMYCIN) 100 MG capsule Take 1 capsule (100 mg total) by mouth 2 (two) times daily. 05/05/17   Hassan Rowan, MD  sulfamethoxazole-trimethoprim (BACTRIM DS,SEPTRA DS) 800-160 MG tablet Take 1  tablet by mouth 2 (two) times daily. 05/02/17 05/09/17  Renford Dills, NP    Family History History reviewed. No pertinent family history.  Social History Social History  Substance Use Topics  . Smoking status: Never Smoker  . Smokeless tobacco: Never Used  . Alcohol use No     Allergies   Patient has no known allergies.   Review of Systems Review of Systems  Skin: Positive for color change and wound.  All other systems reviewed and are negative.    Physical Exam Triage Vital Signs ED Triage Vitals  Enc Vitals Group     BP 05/05/17 1505 (!) 146/96     Pulse Rate 05/05/17 1505 71     Resp 05/05/17 1505 16     Temp 05/05/17 1505 98.1 F (36.7 C)     Temp Source 05/05/17 1505 Oral     SpO2 05/05/17 1505 100 %     Weight 05/05/17 1503 240 lb (108.9 kg)     Height 05/05/17 1503 6\' 1"  (1.854 m)     Head Circumference --      Peak Flow --      Pain Score 05/05/17 1504 0     Pain Loc --      Pain Edu? --      Excl. in GC? --    No data found.   Updated Vital Signs BP (!) 146/96 (BP Location: Left Arm)   Pulse 71  Temp 98.1 F (36.7 C) (Oral)   Resp 16   Ht 6\' 1"  (1.854 m)   Wt 240 lb (108.9 kg)   SpO2 100%   BMI 31.66 kg/m   Visual Acuity Right Eye Distance:   Left Eye Distance:   Bilateral Distance:    Right Eye Near:   Left Eye Near:    Bilateral Near:     Physical Exam  Constitutional: He is oriented to person, place, and time. He appears well-developed and well-nourished.  HENT:  Head: Normocephalic and atraumatic.  Eyes: Pupils are equal, round, and reactive to light.  Neck: Normal range of motion. Neck supple.  Cardiovascular: Normal rate.   Pulmonary/Chest: Effort normal.  Musculoskeletal: Normal range of motion. He exhibits tenderness.  Neurological: He is alert and oriented to person, place, and time.  Skin: Rash noted. There is erythema.     Patient with abscess over his left scapula area is tight and indurated and fluctuated    Psychiatric: He has a normal mood and affect.  Vitals reviewed.    UC Treatments / Results  Labs (all labs ordered are listed, but only abnormal results are displayed) Labs Reviewed  AEROBIC CULTURE (SUPERFICIAL SPECIMEN)    EKG  EKG Interpretation None       Radiology No results found.  Procedures .Marland KitchenIncision and Drainage Date/Time: 05/05/2017 4:35 PM Performed by: Hassan Rowan Authorized by: Hassan Rowan   Consent:    Consent obtained:  Verbal   Consent given by:  Patient Location:    Type:  Abscess   Location:  Trunk Pre-procedure details:    Skin preparation:  Betadine and Hibiclens Anesthesia (see MAR for exact dosages):    Anesthesia method:  Local infiltration   Local anesthetic:  Lidocaine 1% w/o epi Procedure type:    Complexity:  Simple Procedure details:    Needle aspiration: yes     Incision types:  Stab incision   Incision depth:  Subcutaneous   Scalpel blade:  11   Wound management:  Probed and deloculated (Irrigated with lidocaine)   Drainage:  Purulent and serous   Drainage amount:  Moderate   Wound treatment:  Wound left open and drain placed   Packing materials:  1/2 in gauze and 1/4 in gauze   Amount 1/4":  Whole pack Post-procedure details:    Patient tolerance of procedure:  Tolerated well, no immediate complications Comments:     Patient had abscess of his left shoulder open whole pack of onset of gauze placed. Area was irrigated with lidocaine patient tolerated procedure well come back in 2 days for for recheck   (including critical care time)  Medications Ordered in UC Medications - No data to display   Initial Impression / Assessment and Plan / UC Course  I have reviewed the triage vital signs and the nursing notes.  Pertinent labs & imaging results that were available during my care of the patient were reviewed by me and considered in my medical decision making (see chart for details).     Patient instructed to use both  .cn Septra until we get the culture back. Return Sunday for recheck and for possible pack removal and then Tuesday again in case the wound has been repacked. Patient will follow-up with me on Tuesday. He was also informed earlier that his [facility] spotted fever titers were positive and recently was placed on Doxil today.  Final Clinical Impressions(s) / UC Diagnoses   Final diagnoses:  Tick bite, subsequent encounter  Insect bite, subsequent encounter  Cellulitis, unspecified cellulitis site    New Prescriptions New Prescriptions   No medications on file    Note: This dictation was prepared with Dragon dictation along with smaller phrase technology. Any transcriptional errors that result from this process are unintentional.   Hassan RowanWade, Alexys Lobello, MD 05/05/17 1640

## 2017-05-05 NOTE — Discharge Instructions (Signed)
boil 

## 2017-05-07 ENCOUNTER — Encounter: Payer: Self-pay | Admitting: Emergency Medicine

## 2017-05-07 ENCOUNTER — Ambulatory Visit
Admission: EM | Admit: 2017-05-07 | Discharge: 2017-05-07 | Disposition: A | Discharge status: Home / Self Care | Payer: 59

## 2017-05-07 DIAGNOSIS — Z5189 Encounter for other specified aftercare: Secondary | ICD-10-CM

## 2017-05-07 NOTE — ED Provider Notes (Signed)
CSN: [id number]     Arrival date & time 05/07/17  4098 History   First MD Initiated Contact with Patient 05/07/17 [phone]     Chief Complaint  Patient presents with  . Wound Check   (Consider location/radiation/quality/duration/timing/severity/associated sxs/prior Treatment) HPI  41 year old male presents to urgent For Recheck of His Left Thoracic Abscess. Patient Had Incision and Drainage Performed 2 Days Ago. He Is Placed on Doxy and Bactrim. [place] Spotted Fever Came Back Positive. Patient States She Is Doing Well Pain Is Improving. He Denies Any Fevers. No Significant Drainage.  Past Medical History:  Diagnosis Date  . Hypertension    "under control now"   Past Surgical History:  Procedure Laterality Date  . SEPTOPLASTY Bilateral 10/13/2016   Procedure: SEPTOPLASTY;  Surgeon: Vernie Murders, MD;  [facility]: [facility];  Service: ENT;  Laterality: Bilateral;  . TURBINATE REDUCTION Bilateral 10/13/2016   Procedure: BILATERAL INFERIOR TURBINATE REDUCTION AND RIGHT MIDDLE;  Surgeon: Vernie Murders, MD;  Location: [facility];  Service: ENT;  Laterality: Bilateral;   History reviewed. No pertinent family history. Social History  Substance Use Topics  . Smoking status: Never Smoker  . Smokeless tobacco: Never Used  . Alcohol use No    Review of Systems  Constitutional: Negative for fever.  Respiratory: Negative for shortness of breath.   Cardiovascular: Negative for chest pain.  Skin: Positive for wound.    Allergies  Patient has no known allergies.  Home Medications   Prior to Admission medications   Medication Sig Start Date End Date Taking? Authorizing Provider  buPROPion (WELLBUTRIN XL) 300 MG 24 hr tablet Take 300 mg by mouth daily.    [provider]  doxycycline (VIBRAMYCIN) 100 MG capsule Take 1 capsule (100 mg total) by mouth 2 (two) times daily. 05/05/17   Hassan Rowan, MD  sulfamethoxazole-trimethoprim (BACTRIM DS,SEPTRA DS) 800-160 MG  tablet Take 1 tablet by mouth 2 (two) times daily. 05/02/17 05/09/17  Renford Dills, NP   Meds Ordered and Administered this Visit  Medications - No data to display  BP (!) 142/97 (BP Location: Left Arm)   Pulse (!) 59   Temp 97.5 F (36.4 C) (Oral)   Resp 16   Ht 6\' 1"  (1.854 m)   Wt 240 lb (108.9 kg)   SpO2 99%   BMI 31.66 kg/m  No data found.   Physical Exam  HENT:  Head: Normocephalic and atraumatic.  Cardiovascular: Normal rate.   Pulmonary/Chest: Effort normal. No respiratory distress.  Skin:  Examination of the back shows patient has a left-sided upper thoracic abscess, 3 x 3 cm in diameter with mild induration with no significant erythema warmth present. Packing is removed, no purulent drainage is visualized. Wound is repacked with antimicrobial packing.    Urgent Care Course     Procedures (including critical care time)  Labs Review Labs Reviewed - No data to display  Imaging Review No results found.   Visual Acuity Review  Right Eye Distance:   Left Eye Distance:   Bilateral Distance:    Right Eye Near:   Left Eye Near:    Bilateral Near:         MDM   1. Abscess re-check   2. Wound check, abscess    41 year old male with abscess recheck. Abscess appears well, healing. No significant surrounding cellulitis. Patient's pain has been improving. Wound is repacked today and he'll follow-up in 2 days for recheck. Continue Bactrim and Doxy.    Amador Cunas  C, PA-C 05/07/17 0901

## 2017-05-07 NOTE — Discharge Instructions (Signed)
Follow-up with med in urgent care in 2 days for wound recheck and packing removal. Continue with antibiotics.

## 2017-05-07 NOTE — ED Triage Notes (Signed)
Patient here for wound check and possible repacking.  Patient denies fevers or chills.

## 2017-05-09 ENCOUNTER — Encounter: Payer: Self-pay | Admitting: *Deleted

## 2017-05-09 ENCOUNTER — Ambulatory Visit
Admission: EM | Admit: 2017-05-09 | Discharge: 2017-05-09 | Disposition: A | Discharge status: Home / Self Care | Payer: 59 | Attending: Family Medicine | Admitting: Family Medicine

## 2017-05-09 DIAGNOSIS — Z5189 Encounter for other specified aftercare: Secondary | ICD-10-CM

## 2017-05-09 LAB — AEROBIC CULTURE  (SUPERFICIAL SPECIMEN): SPECIAL REQUESTS: NORMAL

## 2017-05-09 LAB — AEROBIC CULTURE W GRAM STAIN (SUPERFICIAL SPECIMEN): Culture: NORMAL

## 2017-05-09 MED ORDER — MUPIROCIN 2 % EX OINT: 1.0000 "application " | TOPICAL_OINTMENT | Freq: Two times a day (BID) | CUTANEOUS | 0 refills | Status: DC

## 2017-05-09 NOTE — ED Provider Notes (Signed)
[facility]    CSN: [id number] Arrival date & time: 05/09/17  0806     History   Chief Complaint Chief Complaint  Patient presents with  . Wound Check    HPI Julian Murphy is a 41 y.o. male.   Patient's here for wound check/ abscess recheck   The history is provided by the patient. No language interpreter was used.  Wound Check  This is a new problem. The current episode started more than 2 days ago. The problem has been gradually improving. Pertinent negatives include no chest pain, no abdominal pain, no headaches and no shortness of breath. Nothing aggravates the symptoms. Nothing relieves the symptoms. Julian Murphy has tried nothing for the symptoms. The treatment provided moderate relief.    Past Medical History:  Diagnosis Date  . Hypertension    "under control now"    There are no active problems to display for this patient.   Past Surgical History:  Procedure Laterality Date  . SEPTOPLASTY Bilateral 10/13/2016   Procedure: SEPTOPLASTY;  Surgeon: Vernie Murders, MD;  Location: [facility];  Service: ENT;  Laterality: Bilateral;  . TURBINATE REDUCTION Bilateral 10/13/2016   Procedure: BILATERAL INFERIOR TURBINATE REDUCTION AND RIGHT MIDDLE;  Surgeon: Vernie Murders, MD;  Location: [facility];  Service: ENT;  Laterality: Bilateral;       Home Medications    Prior to Admission medications   Medication Sig Start Date End Date Taking? Authorizing Provider  buPROPion (WELLBUTRIN XL) 300 MG 24 hr tablet Take 300 mg by mouth daily.   Yes [provider]  doxycycline (VIBRAMYCIN) 100 MG capsule Take 1 capsule (100 mg total) by mouth 2 (two) times daily. 05/05/17  Yes Hassan Rowan, MD  sulfamethoxazole-trimethoprim (BACTRIM DS,SEPTRA DS) 800-160 MG tablet Take 1 tablet by mouth 2 (two) times daily. 05/02/17 05/09/17 Yes Renford Dills, NP  mupirocin ointment (BACTROBAN) 2 % Apply 1 application topically 2 (two) times daily. 05/09/17   Hassan Rowan,  MD    Family History History reviewed. No pertinent family history.  Social History Social History  Substance Use Topics  . Smoking status: Never Smoker  . Smokeless tobacco: Never Used  . Alcohol use No     Allergies   Patient has no known allergies.   Review of Systems Review of Systems  Respiratory: Negative for shortness of breath.   Cardiovascular: Negative for chest pain.  Gastrointestinal: Negative for abdominal pain.  Skin: Positive for wound.  Neurological: Negative for headaches.  All other systems reviewed and are negative.    Physical Exam Triage Vital Signs ED Triage Vitals  Enc Vitals Group     BP 05/09/17 0847 135/89     Pulse Rate 05/09/17 0847 (!) 56     Resp 05/09/17 0847 16     Temp 05/09/17 0847 98.5 F (36.9 C)     Temp Source 05/09/17 0847 Oral     SpO2 05/09/17 0847 99 %     Weight --      Height --      Head Circumference --      Peak Flow --      Pain Score 05/09/17 0849 0     Pain Loc --      Pain Edu? --      Excl. in GC? --    No data found.   Updated Vital Signs BP 135/89 (BP Location: Left Arm)   Pulse (!) 56   Temp 98.5 F (36.9 C) (Oral)  Resp 16   SpO2 99%   Visual Acuity Right Eye Distance:   Left Eye Distance:   Bilateral Distance:    Right Eye Near:   Left Eye Near:    Bilateral Near:     Physical Exam  Constitutional: Julian Murphy is oriented to person, place, and time. Julian Murphy appears well-developed and well-nourished. No distress.  HENT:  Head: Normocephalic.  Eyes: Pupils are equal, round, and reactive to light.  Neck: Neck supple.  Cardiovascular: Normal rate and regular rhythm.   Musculoskeletal: Julian Murphy exhibits tenderness.  Neurological: Julian Murphy is alert and oriented to person, place, and time.  Skin: Skin is warm. Lesion noted. Julian Murphy is not diaphoretic.     Abscess over the left shoulder scapular area is healing well drain was removed. The abscess is open allowing pus and other material to come forth adequately there  is minimal amount of redness around the wound  Vitals reviewed.    UC Treatments / Results  Labs (all labs ordered are listed, but only abnormal results are displayed) Labs Reviewed - No data to display  EKG  EKG Interpretation None       Radiology No results found.  Procedures Procedures (including critical care time)  Medications Ordered in UC Medications - No data to display   Initial Impression / Assessment and Plan / UC Course  I have reviewed the triage vital signs and the nursing notes.  Pertinent labs & imaging results that were available during my care of the patient were reviewed by me and considered in my medical decision making (see chart for details).   abscesses healing well. Recommend no swimming for least 2 weeks back pain ointment on the wound 2-3 times a day as needed continue the doxycycline the Septra should be finished. Return when necessary or if needed for any further problems or follow-up with PCP   Final Clinical Impressions(s) / UC Diagnoses   Final diagnoses:  Visit for wound check  Wound check, abscess    New Prescriptions New Prescriptions   MUPIROCIN OINTMENT (BACTROBAN) 2 %    Apply 1 application topically 2 (two) times daily.    Note: This dictation was prepared with Dragon dictation along with smaller phrase technology. Any transcriptional errors that result from this process are unintentional.   Hassan RowanWade, Vianca Bracher, MD 05/09/17 [phone]

## 2017-05-09 NOTE — ED Triage Notes (Signed)
Patient has arrived for wound check and packing removal.

## 2017-07-10 ENCOUNTER — Ambulatory Visit
Admission: EM | Admit: 2017-07-10 | Discharge: 2017-07-10 | Disposition: A | Discharge status: Home / Self Care | Payer: 59 | Attending: Family Medicine | Admitting: Family Medicine

## 2017-07-10 DIAGNOSIS — J301 Allergic rhinitis due to pollen: Secondary | ICD-10-CM

## 2017-07-10 DIAGNOSIS — T7840XA Allergy, unspecified, initial encounter: Secondary | ICD-10-CM | POA: Diagnosis not present

## 2017-07-10 HISTORY — DX: Anxiety disorder, unspecified: F41.9

## 2017-07-10 HISTORY — DX: Other allergic rhinitis: J30.89

## 2017-07-10 MED ORDER — PREDNISONE 20 MG PO TABS: ORAL_TABLET | ORAL | 0 refills | Status: DC

## 2017-07-10 NOTE — ED Triage Notes (Signed)
Pt was bush hogging and he saw pollen flying "everywhere" Soon after he started with eyes red and tearing, eyelids swollen, eyes itching, clear nasal drainage. Took Benadryl 50mg . PTA.

## 2017-07-10 NOTE — ED Provider Notes (Signed)
[facility]    CSN: [id number] Arrival date & time: 07/10/17  1423     History   Chief Complaint Chief Complaint  Patient presents with  . Allergies    HPI Julian Murphy is a 41 y.o. male.   41 yo male with a c/o eye redness, eyelid swelling, nasal congestion and runny nose today since exposure to pollen while bush hogging earlier today. Denies any wheezing, chest pains, shortness of breath, fevers, chills, throat swelling. States took 50mg  of benadryl which has helped some. Patient has a h/o environmental/seasonal allergies.    The history is provided by the patient.    Past Medical History:  Diagnosis Date  . Anxiety   . Environmental and seasonal allergies   . Hypertension    "under control now"    There are no active problems to display for this patient.   Past Surgical History:  Procedure Laterality Date  . SEPTOPLASTY Bilateral 10/13/2016   Procedure: SEPTOPLASTY;  Surgeon: Vernie MurdersPaul Juengel, MD;  [facility];  Service: ENT;  Laterality: Bilateral;  . TURBINATE REDUCTION Bilateral 10/13/2016   Procedure: BILATERAL INFERIOR TURBINATE REDUCTION AND RIGHT MIDDLE;  Surgeon: Vernie MurdersPaul Juengel, MD;  [facility];  Service: ENT;  Laterality: Bilateral;       Home Medications    Prior to Admission medications   Medication Sig Start Date End Date Taking? Authorizing Provider  buPROPion (WELLBUTRIN XL) 300 MG 24 hr tablet Take 300 mg by mouth daily.    [provider]  doxycycline (VIBRAMYCIN) 100 MG capsule Take 1 capsule (100 mg total) by mouth 2 (two) times daily. 05/05/17   Hassan RowanWade, Eugene, MD  mupirocin ointment (BACTROBAN) 2 % Apply 1 application topically 2 (two) times daily. 05/09/17   Hassan RowanWade, Eugene, MD  predniSONE (DELTASONE) 20 MG tablet 3 tabs po x 2 days, then 2 tabs po qd for 2 days, then 1 tab po qd for 2 days 07/10/17   Payton Mccallumonty, Larhonda Dettloff, MD    Family History History reviewed. No pertinent family history.  Social  History Social History  Substance Use Topics  . Smoking status: Never Smoker  . Smokeless tobacco: Never Used  . Alcohol use No     Allergies   Patient has no known allergies.   Review of Systems Review of Systems   Physical Exam Triage Vital Signs ED Triage Vitals [07/10/17 1447]  Enc Vitals Group     BP (!) 151/92     Pulse Rate 60     Resp 18     Temp 97.6 F (36.4 C)     Temp Source Oral     SpO2 100 %     Weight 240 lb (108.9 kg)     Height 6\' 1"  (1.854 m)     Head Circumference      Peak Flow      Pain Score      Pain Loc      Pain Edu?      Excl. in GC?    No data found.   Updated Vital Signs BP (!) 151/92 (BP Location: Left Arm)   Pulse 60   Temp 97.6 F (36.4 C) (Oral)   Resp 18   Ht 6\' 1"  (1.854 m)   Wt 240 lb (108.9 kg)   SpO2 100%   BMI 31.66 kg/m   Visual Acuity Right Eye Distance:   Left Eye Distance:   Bilateral Distance:    Right Eye Near:  Left Eye Near:    Bilateral Near:     Physical Exam  Constitutional: He appears well-developed and well-nourished. No distress.  HENT:  Head: Normocephalic and atraumatic.  Right Ear: Tympanic membrane, external ear and ear canal normal.  Left Ear: Tympanic membrane, external ear and ear canal normal.  Nose: Rhinorrhea present.  Mouth/Throat: Uvula is midline, oropharynx is clear and moist and mucous membranes are normal. No oropharyngeal exudate or tonsillar abscesses.  Eyes: Pupils are equal, round, and reactive to light. EOM are normal. Right eye exhibits no discharge. Left eye exhibits no discharge. Right conjunctiva is injected. Left conjunctiva is injected. No scleral icterus.  Bilateral eyelids edematous and erythematous with injected conjunctiva  Neck: Normal range of motion. Neck supple. No tracheal deviation present. No thyromegaly present.  Cardiovascular: Normal rate, regular rhythm and normal heart sounds.   Pulmonary/Chest: Effort normal and breath sounds normal. No stridor. No  respiratory distress. He has no wheezes. He has no rales. He exhibits no tenderness.  Lymphadenopathy:    He has no cervical adenopathy.  Neurological: He is alert.  Skin: Skin is warm and dry. No rash noted. He is not diaphoretic.  Nursing note and vitals reviewed.    UC Treatments / Results  Labs (all labs ordered are listed, but only abnormal results are displayed) Labs Reviewed - No data to display  EKG  EKG Interpretation None       Radiology No results found.  Procedures Procedures (including critical care time)  Medications Ordered in UC Medications - No data to display   Initial Impression / Assessment and Plan / UC Course  I have reviewed the triage vital signs and the nursing notes.  Pertinent labs & imaging results that were available during my care of the patient were reviewed by me and considered in my medical decision making (see chart for details).       Final Clinical Impressions(s) / UC Diagnoses   Final diagnoses:  Allergic reaction, initial encounter    New Prescriptions Discharge Medication List as of 07/10/2017  3:58 PM    START taking these medications   Details  predniSONE (DELTASONE) 20 MG tablet 3 tabs po x 2 days, then 2 tabs po qd for 2 days, then 1 tab po qd for 2 days, Normal       1. diagnosis reviewed with patient 2. rx as per orders above; reviewed possible side effects, interactions, risks and benefits  3. Recommend supportive treatment with otc antihistamines  4. Follow-up prn if symptoms worsen or don't improve  Controlled Substance Prescriptions [place] Controlled Substance Registry consulted? Not Applicable   Payton Mccallum, MD 07/10/17 [phone]

## 2017-07-10 NOTE — Discharge Instructions (Signed)
Over the counter antihistamines (zyrtec, claritin, or allegra) plus Benadryl

## 2018-02-21 DIAGNOSIS — Z0110 Encounter for examination of ears and hearing without abnormal findings: Secondary | ICD-10-CM | POA: Diagnosis not present

## 2018-02-21 DIAGNOSIS — Z020 Encounter for examination for admission to educational institution: Secondary | ICD-10-CM | POA: Diagnosis not present

## 2018-02-21 DIAGNOSIS — Z021 Encounter for pre-employment examination: Secondary | ICD-10-CM | POA: Diagnosis not present

## 2018-02-21 DIAGNOSIS — Z0000 Encounter for general adult medical examination without abnormal findings: Secondary | ICD-10-CM | POA: Diagnosis not present

## 2018-11-19 ENCOUNTER — Ambulatory Visit
Admission: EM | Admit: 2018-11-19 | Discharge: 2018-11-19 | Disposition: A | Discharge status: Home / Self Care | Payer: 59 | Attending: Family Medicine | Admitting: Family Medicine

## 2018-11-19 ENCOUNTER — Other Ambulatory Visit: Payer: Self-pay

## 2018-11-19 ENCOUNTER — Encounter: Payer: Self-pay | Admitting: Emergency Medicine

## 2018-11-19 DIAGNOSIS — R05 Cough: Secondary | ICD-10-CM

## 2018-11-19 DIAGNOSIS — R69 Illness, unspecified: Secondary | ICD-10-CM | POA: Insufficient documentation

## 2018-11-19 DIAGNOSIS — R509 Fever, unspecified: Secondary | ICD-10-CM | POA: Diagnosis not present

## 2018-11-19 DIAGNOSIS — J09X2 Influenza due to identified novel influenza A virus with other respiratory manifestations: Secondary | ICD-10-CM

## 2018-11-19 DIAGNOSIS — J111 Influenza due to unidentified influenza virus with other respiratory manifestations: Secondary | ICD-10-CM

## 2018-11-19 LAB — RAPID INFLUENZA A&B ANTIGENS (ARMC ONLY)
INFLUENZA A (ARMC): POSITIVE — AB
INFLUENZA B (ARMC): NEGATIVE

## 2018-11-19 LAB — RAPID STREP SCREEN (MED CTR MEBANE ONLY): Streptococcus, Group A Screen (Direct): NEGATIVE

## 2018-11-19 MED ORDER — ACETAMINOPHEN 500 MG PO TABS
1000.0000 mg | ORAL_TABLET | Freq: Once | ORAL | Status: AC
  Administered 2018-11-19: 1000 mg via ORAL

## 2018-11-19 MED ORDER — HYDROCOD POLST-CPM POLST ER 10-8 MG/5ML PO SUER: 5.0000 mL | Freq: Every evening | ORAL | 0 refills | Status: DC | PRN

## 2018-11-19 MED ORDER — OSELTAMIVIR PHOSPHATE 75 MG PO CAPS: 75.0000 mg | ORAL_CAPSULE | Freq: Two times a day (BID) | ORAL | 0 refills | Status: DC

## 2018-11-19 NOTE — ED Provider Notes (Signed)
[facility] ____________________________________________  Time seen: Approximately 8:54 AM  I have reviewed the triage vital signs and the nursing notes.   HISTORY  Chief Complaint Cough and Fever   HPI Julian Murphy is a 43 y.o. male presenting for evaluation of 2 days of cough, congestion, chills, body aches, subjective fever.  Reports Saturday at symptom onset he had accompanying vomiting, but none since.  No diarrhea.  No specific abdominal pain.  States some sore throat predominantly from coughing.  States symptoms apparently started Saturday evening.  Continues to tolerate fluids well.  Has been taken over-the-counter ibuprofen, Tylenol and cough medication.  Last took ibuprofen around 7 this morning.  Denies other aggravating alleviating factors.  Denies known direct sick contacts.  Denies recent sickness.  Denies having chest pain, shortness of breath.  Reports otherwise doing well denies other complaints.   Past Medical History:  Diagnosis Date  . Anxiety   . Environmental and seasonal allergies   . Hypertension    "under control now"    There are no active problems to display for this patient.   Past Surgical History:  Procedure Laterality Date  . SEPTOPLASTY Bilateral 10/13/2016   Procedure: SEPTOPLASTY;  Surgeon: Vernie Murders, MD;  Location: [facility];  Service: ENT;  Laterality: Bilateral;  . TURBINATE REDUCTION Bilateral 10/13/2016   Procedure: BILATERAL INFERIOR TURBINATE REDUCTION AND RIGHT MIDDLE;  Surgeon: Vernie Murders, MD;  Location: [facility];  Service: ENT;  Laterality: Bilateral;     No current facility-administered medications for this encounter.   Current Outpatient Medications:  .  chlorpheniramine-HYDROcodone (TUSSIONEX PENNKINETIC ER) 10-8 MG/5ML SUER, Take 5 mLs by mouth at bedtime as needed for cough. do not drive or operate machinery while taking as can cause drowsiness., Disp: 50 mL, Rfl: 0 .  oseltamivir  (TAMIFLU) 75 MG capsule, Take 1 capsule (75 mg total) by mouth every 12 (twelve) hours., Disp: 10 capsule, Rfl: 0  Allergies Patient has no known allergies.  History reviewed. No pertinent family history.  Social History Social History   Tobacco Use  . Smoking status: Never Smoker  . Smokeless tobacco: Current User    Types: Chew  Substance Use Topics  . Alcohol use: Not Currently  . Drug use: No    Review of Systems Constitutional: Positive subjective fever. ENT: As above Cardiovascular: Denies chest pain. Respiratory: Denies shortness of breath. Gastrointestinal: No abdominal pain.   Musculoskeletal: Negative for back pain. Skin: Negative for rash.  ____________________________________________   PHYSICAL EXAM:  VITAL SIGNS: ED Triage Vitals  Enc Vitals Group     BP 11/19/18 0837 (!) 121/102     Pulse Rate 11/19/18 0837 87     Resp 11/19/18 0837 18     Temp 11/19/18 0837 (!) 102.5 F (39.2 C)     Temp Source 11/19/18 0837 Oral     SpO2 11/19/18 0837 98 %     Weight 11/19/18 0839 245 lb (111.1 kg)     Height 11/19/18 0839 6' (1.829 m)     Head Circumference --      Peak Flow --      Pain Score 11/19/18 0839 9     Pain Loc --      Pain Edu? --      Excl. in GC? --    Vitals:   11/19/18 0837 11/19/18 0839 11/19/18 0931  BP: (!) 121/102  140/82  Pulse: 87  82  Resp: 18  19  Temp: (!) 102.5  F (39.2 C)  (!) 100.5 F (38.1 C)  TempSrc: Oral    SpO2: 98%  100%  Weight:  245 lb (111.1 kg)   Height:  6' (1.829 m)     Constitutional: Alert and oriented. Well appearing and in no acute distress. Eyes: Conjunctivae are normal.  Head: Atraumatic. No sinus tenderness to palpation. No swelling. No erythema.  Ears: no erythema, normal TMs bilaterally.   Nose:Nasal congestion  Mouth/Throat: Mucous membranes are moist. Mild pharyngeal erythema. No tonsillar swelling or exudate.  Neck: No stridor.  No cervical spine tenderness to  palpation. Hematological/Lymphatic/Immunilogical: No cervical lymphadenopathy. Cardiovascular: Normal rate, regular rhythm. Grossly normal heart sounds.  Good peripheral circulation. Respiratory: Normal respiratory effort.  No retractions. No wheezes, rales or rhonchi. Good air movement.  Gastrointestinal: Soft and nontender.  Musculoskeletal: Ambulatory with steady gait.  Neurologic:  Normal speech and language. No gait instability. Skin:  Skin appears warm, dry and intact. No rash noted. Psychiatric: Mood and affect are normal. Speech and behavior are normal. ___________________________________________   LABS (all labs ordered are listed, but only abnormal results are displayed)  Labs Reviewed  RAPID INFLUENZA A&B ANTIGENS ([facility] ONLY) - Abnormal; Notable for the following components:      Result Value   Influenza A ([facility]) POSITIVE (*)    All other components within normal limits  RAPID STREP SCREEN ([facility] ONLY)  CULTURE, GROUP A STREP [facility])    PROCEDURES Procedures    INITIAL IMPRESSION / ASSESSMENT AND PLAN / ED COURSE  Pertinent labs & imaging results that were available during my care of the patient were reviewed by me and considered in my medical decision making (see chart for details).  Overall well-appearing patient.  No acute distress.  1 g Tylenol given in urgent care.  Suspect influenza.  Influenza A positive.  Discussed treatment options with patient, will Rx Tamiflu, prn Tussionex, over-the-counter Tylenol ibuprofen.  Encourage rest, fluids, supportive care.  Work note given.Discussed indication, risks and benefits of medications with patient.  Discussed follow up with Primary care physician this week. Discussed follow up and return parameters including no resolution or any worsening concerns. Patient verbalized understanding and agreed to plan.   ____________________________________________   FINAL CLINICAL IMPRESSION(S) / ED DIAGNOSES  Final  diagnoses:  Influenza-like illness     ED Discharge Orders         Ordered    chlorpheniramine-HYDROcodone (TUSSIONEX PENNKINETIC ER) 10-8 MG/5ML SUER  At bedtime PRN     11/19/18 0858    oseltamivir (TAMIFLU) 75 MG capsule  Every 12 hours     11/19/18 0858           Note: This dictation was prepared with Dragon dictation along with smaller phrase technology. Any transcriptional errors that result from this process are unintentional.         Renford Dills, NP 11/19/18 1016

## 2018-11-19 NOTE — Discharge Instructions (Signed)
Take medication as prescribed. Rest. Drink plenty of fluids.  ° °Follow up with your primary care physician this week as needed. Return to Urgent care for new or worsening concerns.  ° °

## 2018-11-19 NOTE — ED Triage Notes (Signed)
Patient c/o cough, congestion, fever, headache that started 2 days ago.

## 2018-11-21 LAB — CULTURE, GROUP A STREP (THRC)

## 2018-12-07 ENCOUNTER — Encounter: Payer: Self-pay | Admitting: Emergency Medicine

## 2018-12-07 ENCOUNTER — Other Ambulatory Visit: Payer: Self-pay

## 2018-12-07 ENCOUNTER — Ambulatory Visit
Admission: EM | Admit: 2018-12-07 | Discharge: 2018-12-07 | Disposition: A | Discharge status: Home / Self Care | Payer: 59 | Attending: Family Medicine | Admitting: Family Medicine

## 2018-12-07 DIAGNOSIS — I10 Essential (primary) hypertension: Secondary | ICD-10-CM

## 2018-12-07 DIAGNOSIS — J988 Other specified respiratory disorders: Secondary | ICD-10-CM

## 2018-12-07 MED ORDER — DOXYCYCLINE HYCLATE 100 MG PO CAPS: 100.0000 mg | ORAL_CAPSULE | Freq: Two times a day (BID) | ORAL | 0 refills | Status: AC

## 2018-12-07 NOTE — ED Provider Notes (Signed)
[facility]    CSN: [id number] Arrival date & time: 12/07/18  1428  History   Chief Complaint Chief Complaint  Patient presents with  . Cough   HPI  43 year old male presents with the above complaint.  Patient states that he recently had the flu.  Patient states that he has improved but is still plagued by cough and congestion.  Patient reports sinus pressure and congestion as well as chest congestion.  Productive cough.  Cough medication helps slightly but has not resolved his issue.  No fever.  No shortness of breath.  No chills.  Worse in the morning.  No other associated symptoms.  No other complaints.   PMH, Surgical Hx, Family Hx, Social History reviewed and updated as below.  Past Medical History:  Diagnosis Date  . Anxiety   . Environmental and seasonal allergies   . Hypertension    "under control now"   Past Surgical History:  Procedure Laterality Date  . SEPTOPLASTY Bilateral 10/13/2016   Procedure: SEPTOPLASTY;  Surgeon: Vernie Murders, MD;  Location: [facility];  Service: ENT;  Laterality: Bilateral;  . TURBINATE REDUCTION Bilateral 10/13/2016   Procedure: BILATERAL INFERIOR TURBINATE REDUCTION AND RIGHT MIDDLE;  Surgeon: Vernie Murders, MD;  Location: [facility];  Service: ENT;  Laterality: Bilateral;   Home Medications    Prior to Admission medications   Medication Sig Start Date End Date Taking? Authorizing Provider  doxycycline (VIBRAMYCIN) 100 MG capsule Take 1 capsule (100 mg total) by mouth 2 (two) times daily. 12/07/18   Tommie Sams, DO   Social History Social History   Tobacco Use  . Smoking status: Never Smoker  . Smokeless tobacco: Current User    Types: Chew  Substance Use Topics  . Alcohol use: Not Currently  . Drug use: No    Allergies   Patient has no known allergies.   Review of Systems Review of Systems  Constitutional: Negative for fever.  HENT: Positive for congestion.   Respiratory: Positive for  cough. Negative for shortness of breath.    Physical Exam Triage Vital Signs ED Triage Vitals  Enc Vitals Group     BP 12/07/18 1454 (!) 143/97     Pulse Rate 12/07/18 1454 79     Resp 12/07/18 1454 18     Temp 12/07/18 1454 98.2 F (36.8 C)     Temp Source 12/07/18 1454 Oral     SpO2 12/07/18 1454 98 %     Weight 12/07/18 1452 250 lb (113.4 kg)     Height 12/07/18 1452 6\' 1"  (1.854 m)     Head Circumference --      Peak Flow --      Pain Score 12/07/18 1452 0     Pain Loc --      Pain Edu? --      Excl. in GC? --    Updated Vital Signs BP (!) 143/97 (BP Location: Left Arm)   Pulse 79   Temp 98.2 F (36.8 C) (Oral)   Resp 18   Ht 6\' 1"  (1.854 m)   Wt 113.4 kg   SpO2 98%   BMI 32.98 kg/m   Visual Acuity Right Eye Distance:   Left Eye Distance:   Bilateral Distance:    Right Eye Near:   Left Eye Near:    Bilateral Near:     Physical Exam Vitals signs and nursing note reviewed.  Constitutional:      General: He is not in  acute distress.    Appearance: Normal appearance.  HENT:     Head: Normocephalic and atraumatic.     Right Ear: Tympanic membrane normal.     Left Ear: Tympanic membrane normal.     Mouth/Throat:     Pharynx: Oropharynx is clear. No posterior oropharyngeal erythema.  Eyes:     General:        Right eye: No discharge.        Left eye: No discharge.     Conjunctiva/sclera: Conjunctivae normal.  Cardiovascular:     Rate and Rhythm: Normal rate and regular rhythm.  Pulmonary:     Effort: Pulmonary effort is normal.     Breath sounds: Normal breath sounds.  Neurological:     Mental Status: He is alert.  Psychiatric:        Mood and Affect: Mood normal.        Behavior: Behavior normal.    UC Treatments / Results  Labs (all labs ordered are listed, but only abnormal results are displayed) Labs Reviewed - No data to display  EKG None  Radiology No results found.  Procedures Procedures (including critical care  time)  Medications Ordered in UC Medications - No data to display  Initial Impression / Assessment and Plan / UC Course  I have reviewed the triage vital signs and the nursing notes.  Pertinent labs & imaging results that were available during my care of the patient were reviewed by me and considered in my medical decision making (see chart for details).    43 year old male presents with persistent sinus pressure/congestion as well as chest congestion following influenza.  Concern for superimposed bacterial infection.  Placing on doxycycline.  Final Clinical Impressions(s) / UC Diagnoses   Final diagnoses:  Respiratory infection     Discharge Instructions     Rest. Fluids.  Medication as prescribed. Take with food.  Take care  Dr. Adriana Simas    ED Prescriptions    Medication Sig Dispense Auth. Provider   doxycycline (VIBRAMYCIN) 100 MG capsule Take 1 capsule (100 mg total) by mouth 2 (two) times daily. 14 capsule Tommie Sams, DO     Controlled Substance Prescriptions [place] Controlled Substance Registry consulted? Not Applicable   Tommie Sams, DO 12/07/18 1621

## 2018-12-07 NOTE — Discharge Instructions (Signed)
Rest. Fluids.  Medication as prescribed. Take with food.  Take care  Dr. Adriana Simas

## 2018-12-07 NOTE — ED Triage Notes (Signed)
Pt c/o cough, nasal and chest congestion, sinus pain and pressure. Started about 2 weeks ago when he had the flu.

## 2021-12-23 ENCOUNTER — Encounter: Payer: Self-pay | Admitting: *Deleted

## 2021-12-24 ENCOUNTER — Other Ambulatory Visit: Payer: Self-pay

## 2021-12-24 ENCOUNTER — Ambulatory Visit: Payer: Federal, State, Local not specified - PPO | Admitting: Certified Registered"

## 2021-12-24 ENCOUNTER — Encounter: Payer: Self-pay | Admitting: *Deleted

## 2021-12-24 ENCOUNTER — Ambulatory Visit
Admission: RE | Admit: 2021-12-24 | Discharge: 2021-12-24 | Disposition: A | Discharge status: Home / Self Care | Payer: Federal, State, Local not specified - PPO | Attending: Gastroenterology | Admitting: Gastroenterology

## 2021-12-24 ENCOUNTER — Encounter
Admission: RE | Disposition: A | Discharge status: Home / Self Care | Payer: Self-pay | Source: Home / Self Care | Attending: Gastroenterology

## 2021-12-24 DIAGNOSIS — K222 Esophageal obstruction: Secondary | ICD-10-CM | POA: Diagnosis not present

## 2021-12-24 DIAGNOSIS — F32A Depression, unspecified: Secondary | ICD-10-CM | POA: Insufficient documentation

## 2021-12-24 DIAGNOSIS — Z79899 Other long term (current) drug therapy: Secondary | ICD-10-CM | POA: Diagnosis not present

## 2021-12-24 DIAGNOSIS — K219 Gastro-esophageal reflux disease without esophagitis: Secondary | ICD-10-CM | POA: Insufficient documentation

## 2021-12-24 DIAGNOSIS — F419 Anxiety disorder, unspecified: Secondary | ICD-10-CM | POA: Diagnosis not present

## 2021-12-24 DIAGNOSIS — K2289 Other specified disease of esophagus: Secondary | ICD-10-CM | POA: Diagnosis not present

## 2021-12-24 DIAGNOSIS — R1310 Dysphagia, unspecified: Secondary | ICD-10-CM | POA: Insufficient documentation

## 2021-12-24 DIAGNOSIS — I10 Essential (primary) hypertension: Secondary | ICD-10-CM | POA: Diagnosis not present

## 2021-12-24 HISTORY — DX: Depression, unspecified: F32.A

## 2021-12-24 HISTORY — PX: ESOPHAGOGASTRODUODENOSCOPY (EGD) WITH PROPOFOL: SHX5813

## 2021-12-24 SURGERY — ESOPHAGOGASTRODUODENOSCOPY (EGD) WITH PROPOFOL
Anesthesia: General

## 2021-12-24 MED ORDER — SODIUM CHLORIDE 0.9 % IV SOLN: INTRAVENOUS | Status: DC

## 2021-12-24 MED ORDER — LIDOCAINE HCL (CARDIAC) PF 100 MG/5ML IV SOSY
PREFILLED_SYRINGE | INTRAVENOUS | Status: DC | PRN
  Administered 2021-12-24: 100 mg via INTRAVENOUS

## 2021-12-24 MED ORDER — GLYCOPYRROLATE 0.2 MG/ML IJ SOLN
INTRAMUSCULAR | Status: DC | PRN
  Administered 2021-12-24: .2 mg via INTRAVENOUS

## 2021-12-24 MED ORDER — PROPOFOL 500 MG/50ML IV EMUL
INTRAVENOUS | Status: AC
  Filled 2021-12-24: qty 50

## 2021-12-24 MED ORDER — DEXMEDETOMIDINE HCL IN NACL 200 MCG/50ML IV SOLN
INTRAVENOUS | Status: DC | PRN
Start: 2021-12-24 — End: 2021-12-24
  Administered 2021-12-24: 12 ug via INTRAVENOUS

## 2021-12-24 MED ORDER — PROPOFOL 10 MG/ML IV BOLUS
INTRAVENOUS | Status: DC | PRN
  Administered 2021-12-24: 300 mg via INTRAVENOUS
  Administered 2021-12-24 (×3): 50 mg via INTRAVENOUS

## 2021-12-24 MED ORDER — DEXMEDETOMIDINE HCL IN NACL 80 MCG/20ML IV SOLN
INTRAVENOUS | Status: AC
  Filled 2021-12-24: qty 20

## 2021-12-24 NOTE — Op Note (Signed)
[facility] Patient Name: Julian Murphy Procedure Date: 12/24/2021 8:15 AM MRN: [id number] Account #: [id number] Date of Birth: July 18, 1976 Admit Type: Outpatient Age: 46 Room: [facility] ENDO ROOM 3 Gender: Male Note Status: Finalized Instrument Name: Upper Endoscope [id number] Procedure:             Upper GI endoscopy Indications:           Dysphagia, Gastro-esophageal reflux disease Providers:             Andrey Farmer MD, MD Medicines:             Monitored Anesthesia Care Complications:         No immediate complications. Estimated blood loss:                         Minimal. Procedure:             Pre-Anesthesia Assessment:                        - Prior to the procedure, a History and Physical was                         performed, and patient medications and allergies were                         reviewed. The patient is competent. The risks and                         benefits of the procedure and the sedation options and                         risks were discussed with the patient. All questions                         were answered and informed consent was obtained.                         Patient identification and proposed procedure were                         verified by the physician, the nurse, the                         anesthesiologist, the anesthetist and the technician                         in the endoscopy suite. Mental Status Examination:                         alert and oriented. Airway Examination: normal                         oropharyngeal airway and neck mobility. Respiratory                         Examination: clear to auscultation. CV Examination:                         normal. Prophylactic Antibiotics: The patient does not  require prophylactic antibiotics. Prior                         Anticoagulants: The patient has taken no previous                         anticoagulant or antiplatelet  agents. ASA Grade                         Assessment: II - A patient with mild systemic disease.                         After reviewing the risks and benefits, the patient                         was deemed in satisfactory condition to undergo the                         procedure. The anesthesia plan was to use monitored                         anesthesia care (MAC). Immediately prior to                         administration of medications, the patient was                         re-assessed for adequacy to receive sedatives. The                         heart rate, respiratory rate, oxygen saturations,                         blood pressure, adequacy of pulmonary ventilation, and                         response to care were monitored throughout the                         procedure. The physical status of the patient was                         re-assessed after the procedure.                        After obtaining informed consent, the endoscope was                         passed under direct vision. Throughout the procedure,                         the patient's blood pressure, pulse, and oxygen                         saturations were monitored continuously. The Endoscope                         was introduced through the mouth, and advanced to the  second part of duodenum. The upper GI endoscopy was                         accomplished without difficulty. The patient tolerated                         the procedure well. Findings:      A widely patent Schatzki ring was found in the lower third of the       esophagus.      Normal mucosa was found in the entire esophagus. Biopsies were obtained       from the proximal and distal esophagus with cold forceps for histology       of suspected eosinophilic esophagitis. Estimated blood loss was minimal.      The entire examined stomach was normal.      The examined duodenum was normal. Impression:            -  Widely patent Schatzki ring.                        - Normal mucosa was found in the entire esophagus.                         Biopsied.                        - Normal stomach.                        - Normal examined duodenum. Recommendation:        - Discharge patient to home.                        - Resume previous diet.                        - Continue present medications.                        - Await pathology results.                        - Return to referring physician as previously                         scheduled. The schatzki ring not empirically dilated                         as it was widely patent and patient denied any further                         dysphagia episodes since starting PPI. Repeat EGD with                         dilation can be scheduled if dysphagia recurs. Procedure Code(s):     --- Professional ---                        [phone], Esophagogastroduodenoscopy, flexible,  transoral; with biopsy, single or multiple Diagnosis Code(s):     --- Professional ---                        K22.2, Esophageal obstruction                        R13.10, Dysphagia, unspecified                        K21.9, Gastro-esophageal reflux disease without                         esophagitis CPT copyright 2019 American Medical Association. All rights reserved. The codes documented in this report are preliminary and upon coder review may  be revised to meet current compliance requirements. Andrey Farmer MD, MD 12/24/2021 8:44:46 AM Number of Addenda: 0 Note Initiated On: 12/24/2021 8:15 AM Estimated Blood Loss:  Estimated blood loss was minimal.      [facility]

## 2021-12-24 NOTE — Transfer of Care (Addendum)
Immediate Anesthesia Transfer of Care Note  Patient: Julian Murphy  Procedure(s) Performed: ESOPHAGOGASTRODUODENOSCOPY (EGD) WITH PROPOFOL  Patient Location: PACU  Anesthesia Type:General  Level of Consciousness: awake  Airway & Oxygen Therapy: Patient Spontanous Breathing  Post-op Assessment: Report given to RN  Post vital signs: stable  Last Vitals:  Vitals Value Taken Time  BP    Temp    Pulse    Resp    SpO2      Last Pain:  Vitals:   12/24/21 0751  TempSrc: Temporal  PainSc: 0-No pain         Complications: No notable events documented.

## 2021-12-24 NOTE — Interval H&P Note (Signed)
History and Physical Interval Note:  12/24/2021 8:30 AM  Julian Murphy  has presented today for surgery, with the diagnosis of GERD,PHARYNGOESOPHAGEAL DYSPHAGIA.  The various methods of treatment have been discussed with the patient and family. After consideration of risks, benefits and other options for treatment, the patient has consented to  Procedure(s): ESOPHAGOGASTRODUODENOSCOPY (EGD) WITH PROPOFOL (N/A) as a surgical intervention.  The patient's history has been reviewed, patient examined, no change in status, stable for surgery.  I have reviewed the patient's chart and labs.  Questions were answered to the patient's satisfaction.     Regis Bill  Ok to proceed with EGD

## 2021-12-24 NOTE — Anesthesia Postprocedure Evaluation (Signed)
Anesthesia Post Note  Patient: [name]  Procedure(s) Performed: ESOPHAGOGASTRODUODENOSCOPY (EGD) WITH PROPOFOL  Patient location during evaluation: Endoscopy Anesthesia Type: General Level of consciousness: awake and alert Pain management: pain level controlled Vital Signs Assessment: post-procedure vital signs reviewed and stable Respiratory status: spontaneous breathing, nonlabored ventilation, respiratory function stable and patient connected to nasal cannula oxygen Cardiovascular status: blood pressure returned to baseline and stable Postop Assessment: no apparent nausea or vomiting Anesthetic complications: no   No notable events documented.   Last Vitals:  Vitals:   12/24/21 0853 12/24/21 0903  BP: 107/62 103/73  Pulse:    Resp:    Temp:    SpO2:  99%    Last Pain:  Vitals:   12/24/21 0903  TempSrc:   PainSc: 0-No pain                 [name]

## 2021-12-24 NOTE — Anesthesia Preprocedure Evaluation (Signed)
Anesthesia Evaluation  Patient identified by MRN, date of birth, ID band Patient awake    Reviewed: Allergy & Precautions, NPO status , Patient's Chart, lab work & pertinent test results  History of Anesthesia Complications Negative for: history of anesthetic complications  Airway Mallampati: II  TM Distance: >3 FB Neck ROM: Full    Dental no notable dental hx. (+) Teeth Intact   Pulmonary neg pulmonary ROS, neg sleep apnea, neg COPD, Patient abstained from smoking.Not current smoker,    Pulmonary exam normal breath sounds clear to auscultation       Cardiovascular Exercise Tolerance: Good METShypertension, Pt. on medications (-) CAD and (-) Past MI (-) dysrhythmias  Rhythm:Regular Rate:Normal - Systolic murmurs    Neuro/Psych PSYCHIATRIC DISORDERS Anxiety Depression negative neurological ROS     GI/Hepatic neg GERD  ,(+)     (-) substance abuse  ,   Endo/Other  neg diabetes  Renal/GU negative Renal ROS     Musculoskeletal   Abdominal   Peds  Hematology   Anesthesia Other Findings Past Medical History: No date: Anxiety No date: Depression No date: Environmental and seasonal allergies No date: Hypertension     Comment:  "under control now"  Reproductive/Obstetrics                             Anesthesia Physical Anesthesia Plan  ASA: 2  Anesthesia Plan: General   Post-op Pain Management: Minimal or no pain anticipated   Induction: Intravenous  PONV Risk Score and Plan: 2 and Propofol infusion, TIVA and Ondansetron  Airway Management Planned: Nasal Cannula  Additional Equipment: None  Intra-op Plan:   Post-operative Plan:   Informed Consent: I have reviewed the patients History and Physical, chart, labs and discussed the procedure including the risks, benefits and alternatives for the proposed anesthesia with the patient or authorized representative who has indicated  his/her understanding and acceptance.     Dental advisory given  Plan Discussed with: CRNA and Surgeon  Anesthesia Plan Comments: (Discussed risks of anesthesia with patient, including possibility of difficulty with spontaneous ventilation under anesthesia necessitating airway intervention, PONV, and rare risks such as cardiac or respiratory or neurological events, and allergic reactions. Discussed the role of CRNA in patient's perioperative care. Patient understands.)        Anesthesia Quick Evaluation

## 2021-12-24 NOTE — H&P (Signed)
Outpatient short stay form Pre-procedure 12/24/2021  Regis Bill, MD  Primary Physician: Medicine, [facility]  Reason for visit:  Dysphagia  History of present illness:    46 y/o gentleman with history of hypertension here for EGD for dysphagia that responded to PPI. No neck surgeries. No blood thinners. No family history of GI malignancies.    Current Facility-Administered Medications:    0.9 %  sodium chloride infusion, , Intravenous, Continuous, Shanyn Preisler, Rossie Muskrat, MD, Last Rate: 20 mL/hr at 12/24/21 0815, New Bag at 12/24/21 0815  Medications Prior to Admission  Medication Sig Dispense Refill Last Dose   buPROPion (WELLBUTRIN XL) 300 MG 24 hr tablet Take 300 mg by mouth daily.   12/23/2021   losartan-hydrochlorothiazide (HYZAAR) 100-12.5 MG tablet Take 1 tablet by mouth daily.   12/23/2021   pantoprazole (PROTONIX) 40 MG tablet Take 40 mg by mouth daily.   12/23/2021   doxycycline (VIBRAMYCIN) 100 MG capsule Take 1 capsule (100 mg total) by mouth 2 (two) times daily. (Patient not taking: Reported on 12/24/2021) 14 capsule 0 Completed Course     No Known Allergies   Past Medical History:  Diagnosis Date   Anxiety    Depression    Environmental and seasonal allergies    Hypertension    "under control now"    Review of systems:  Otherwise negative.    Physical Exam  Gen: Alert, oriented. Appears stated age.  HEENT: PERRLA. Lungs: No respiratory distress CV: RRR Abd: soft, benign, no masses Ext: No edema    Planned procedures: Proceed with EGD. The patient understands the nature of the planned procedure, indications, risks, alternatives and potential complications including but not limited to bleeding, infection, perforation, damage to internal organs and possible oversedation/side effects from anesthesia. The patient agrees and gives consent to proceed.  Please refer to procedure notes for findings, recommendations and patient disposition/instructions.      Regis Bill, MD [facility]

## 2021-12-27 ENCOUNTER — Encounter: Payer: Self-pay | Admitting: Gastroenterology

## 2021-12-27 LAB — SURGICAL PATHOLOGY

## 2023-11-16 ENCOUNTER — Other Ambulatory Visit: Payer: Self-pay

## 2023-11-16 ENCOUNTER — Ambulatory Visit: Payer: Federal, State, Local not specified - PPO | Admitting: Anesthesiology

## 2023-11-16 ENCOUNTER — Encounter
Admission: RE | Disposition: A | Discharge status: Home / Self Care | Payer: Self-pay | Source: Ambulatory Visit | Attending: Gastroenterology

## 2023-11-16 ENCOUNTER — Encounter: Payer: Self-pay | Admitting: Gastroenterology

## 2023-11-16 ENCOUNTER — Ambulatory Visit
Admission: RE | Admit: 2023-11-16 | Discharge: 2023-11-16 | Disposition: A | Discharge status: Home / Self Care | Payer: Federal, State, Local not specified - PPO | Source: Ambulatory Visit | Attending: Gastroenterology | Admitting: Gastroenterology

## 2023-11-16 DIAGNOSIS — Z1211 Encounter for screening for malignant neoplasm of colon: Secondary | ICD-10-CM | POA: Insufficient documentation

## 2023-11-16 DIAGNOSIS — D122 Benign neoplasm of ascending colon: Secondary | ICD-10-CM | POA: Diagnosis not present

## 2023-11-16 DIAGNOSIS — D123 Benign neoplasm of transverse colon: Secondary | ICD-10-CM | POA: Diagnosis not present

## 2023-11-16 DIAGNOSIS — D124 Benign neoplasm of descending colon: Secondary | ICD-10-CM | POA: Diagnosis not present

## 2023-11-16 DIAGNOSIS — I10 Essential (primary) hypertension: Secondary | ICD-10-CM | POA: Insufficient documentation

## 2023-11-16 HISTORY — PX: COLONOSCOPY WITH PROPOFOL: SHX5780

## 2023-11-16 HISTORY — PX: POLYPECTOMY: SHX5525

## 2023-11-16 SURGERY — COLONOSCOPY WITH PROPOFOL
Anesthesia: General

## 2023-11-16 MED ORDER — LIDOCAINE HCL (CARDIAC) PF 100 MG/5ML IV SOSY
PREFILLED_SYRINGE | INTRAVENOUS | Status: DC | PRN
  Administered 2023-11-16: 100 mg via INTRAVENOUS

## 2023-11-16 MED ORDER — MIDAZOLAM HCL 2 MG/2ML IJ SOLN
INTRAMUSCULAR | Status: AC
  Filled 2023-11-16: qty 2

## 2023-11-16 MED ORDER — PROPOFOL 10 MG/ML IV BOLUS
INTRAVENOUS | Status: DC | PRN
  Administered 2023-11-16: 20 mg via INTRAVENOUS
  Administered 2023-11-16: 30 mg via INTRAVENOUS
  Administered 2023-11-16: 70 mg via INTRAVENOUS
  Administered 2023-11-16: 20 mg via INTRAVENOUS

## 2023-11-16 MED ORDER — MIDAZOLAM HCL 2 MG/2ML IJ SOLN
INTRAMUSCULAR | Status: DC | PRN
  Administered 2023-11-16: 2 mg via INTRAVENOUS

## 2023-11-16 MED ORDER — SODIUM CHLORIDE 0.9 % IV SOLN: INTRAVENOUS | Status: DC

## 2023-11-16 MED ORDER — PROPOFOL 500 MG/50ML IV EMUL
INTRAVENOUS | Status: DC | PRN
  Administered 2023-11-16: 175 ug/kg/min via INTRAVENOUS

## 2023-11-16 NOTE — Anesthesia Preprocedure Evaluation (Signed)
 Anesthesia Evaluation  Patient identified by MRN, date of birth, ID band Patient awake    Reviewed: Allergy & Precautions, NPO status , Patient's Chart, lab work & pertinent test results  Airway Mallampati: III  TM Distance: <3 FB Neck ROM: full    Dental  (+) Chipped   Pulmonary neg pulmonary ROS, neg shortness of breath   Pulmonary exam normal        Cardiovascular Exercise Tolerance: Good hypertension, (-) angina Normal cardiovascular exam     Neuro/Psych negative neurological ROS  negative psych ROS   GI/Hepatic negative GI ROS, Neg liver ROS,neg GERD  ,,  Endo/Other  negative endocrine ROS    Renal/GU negative Renal ROS  negative genitourinary   Musculoskeletal   Abdominal   Peds  Hematology negative hematology ROS (+)   Anesthesia Other Findings Past Medical History: No date: Anxiety No date: Depression No date: Environmental and seasonal allergies No date: Hypertension     Comment:  under control now  Past Surgical History: 12/24/2021: ESOPHAGOGASTRODUODENOSCOPY (EGD) WITH PROPOFOL ; N/A     Comment:  Procedure: ESOPHAGOGASTRODUODENOSCOPY (EGD) WITH               PROPOFOL ;  Surgeon: Maryruth Ole DASEN, MD;  Location:               [facility] ENDOSCOPY;  Service: Endoscopy;  Laterality: N/A; 10/13/2016: SEPTOPLASTY; Bilateral     Comment:  Procedure: SEPTOPLASTY;  Surgeon: Deward Argue, MD;                Location: [facility];  Service: ENT;                Laterality: Bilateral; 10/13/2016: TURBINATE REDUCTION; Bilateral     Comment:  Procedure: BILATERAL INFERIOR TURBINATE REDUCTION AND               RIGHT MIDDLE;  Surgeon: Deward Argue, MD;  Location:               [facility];  Service: ENT;  Laterality:               Bilateral; No date: VASECTOMY  BMI    Body Mass Index: 37.60 kg/m      Reproductive/Obstetrics negative OB ROS                              Anesthesia Physical Anesthesia Plan  ASA: 2  Anesthesia Plan: General   Post-op Pain Management:    Induction: Intravenous  PONV Risk Score and Plan: Propofol  infusion and TIVA  Airway Management Planned: Natural Airway and Nasal Cannula  Additional Equipment:   Intra-op Plan:   Post-operative Plan:   Informed Consent: I have reviewed the patients History and Physical, chart, labs and discussed the procedure including the risks, benefits and alternatives for the proposed anesthesia with the patient or authorized representative who has indicated his/her understanding and acceptance.     Dental Advisory Given  Plan Discussed with: Anesthesiologist, CRNA and Surgeon  Anesthesia Plan Comments: (Patient consented for risks of anesthesia including but not limited to:  - adverse reactions to medications - risk of airway placement if required - damage to eyes, teeth, lips or other oral mucosa - nerve damage due to positioning  - sore throat or hoarseness - Damage to heart, brain, nerves, lungs, other parts of body or loss of life  Patient voiced understanding and assent.)  Anesthesia Quick Evaluation

## 2023-11-16 NOTE — Op Note (Signed)
 [facility] Patient Name: Julian Murphy Procedure Date: 11/16/2023 11:51 AM MRN: [id number] Account #: [id number] Date of Birth: 10/16/1976 Admit Type: Outpatient Age: 48 Room: [facility] ENDO ROOM 1 Gender: Male Note Status: Finalized Instrument Name: Colonoscope [id number] Procedure:             Colonoscopy Indications:           Screening for colorectal malignant neoplasm Providers:             Elspeth Ozell Jungling DO, DO Referring MD:          No Local Md, MD (Referring MD) Medicines:             Monitored Anesthesia Care Complications:         No immediate complications. Estimated blood loss:                         Minimal. Procedure:             Pre-Anesthesia Assessment:                        - Prior to the procedure, a History and Physical was                         performed, and patient medications and allergies were                         reviewed. The patient is competent. The risks and                         benefits of the procedure and the sedation options and                         risks were discussed with the patient. All questions                         were answered and informed consent was obtained.                         Patient identification and proposed procedure were                         verified by the physician, the nurse, the anesthetist                         and the technician in the endoscopy suite. Mental                         Status Examination: alert and oriented. Airway                         Examination: normal oropharyngeal airway and neck                         mobility. Respiratory Examination: clear to                         auscultation. CV Examination: RRR, no murmurs, no S3  or S4. Prophylactic Antibiotics: The patient does not                         require prophylactic antibiotics. Prior                         Anticoagulants: The patient has taken no anticoagulant                          or antiplatelet agents. ASA Grade Assessment: II - A                         patient with mild systemic disease. After reviewing                         the risks and benefits, the patient was deemed in                         satisfactory condition to undergo the procedure. The                         anesthesia plan was to use monitored anesthesia care                         (MAC). Immediately prior to administration of                         medications, the patient was re-assessed for adequacy                         to receive sedatives. The heart rate, respiratory                         rate, oxygen saturations, blood pressure, adequacy of                         pulmonary ventilation, and response to care were                         monitored throughout the procedure. The physical                         status of the patient was re-assessed after the                         procedure.                        After obtaining informed consent, the colonoscope was                         passed under direct vision. Throughout the procedure,                         the patient's blood pressure, pulse, and oxygen                         saturations were monitored continuously. The  Colonoscope was introduced through the anus and                         advanced to the the terminal ileum, with                         identification of the appendiceal orifice and IC                         valve. The colonoscopy was performed without                         difficulty. The patient tolerated the procedure well.                         The quality of the bowel preparation was evaluated                         using the BBPS [facility] Bowel Preparation Scale) with                         scores of: Right Colon = 2 (minor amount of residual                         staining, small fragments of stool and/or opaque                         liquid, but  mucosa seen well), Transverse Colon = 3                         (entire mucosa seen well with no residual staining,                         small fragments of stool or opaque liquid) and Left                         Colon = 3 (entire mucosa seen well with no residual                         staining, small fragments of stool or opaque liquid).                         The total BBPS score equals 8. The quality of the                         bowel preparation was excellent. The terminal ileum,                         ileocecal valve, appendiceal orifice, and rectum were                         photographed. Findings:      The perianal and digital rectal examinations were normal. Pertinent       negatives include normal sphincter tone.      The terminal ileum appeared normal.      Retroflexion in the right colon was performed.      Four sessile  polyps were found in the descending colon (1), transverse       colon (2) and ascending colon (1). The polyps were 1 to 2 mm in size.       These polyps were removed with a jumbo cold forceps. Resection and       retrieval were complete. Estimated blood loss was minimal.      The exam was otherwise without abnormality on direct and retroflexion       views. Impression:            - The examined portion of the ileum was normal.                        - Four 1 to 2 mm polyps in the descending colon, in                         the transverse colon and in the ascending colon,                         removed with a jumbo cold forceps. Resected and                         retrieved.                        - The examination was otherwise normal on direct and                         retroflexion views. Recommendation:        - Patient has a contact number available for                         emergencies. The signs and symptoms of potential                         delayed complications were discussed with the patient.                         Return to  normal activities tomorrow. Written                         discharge instructions were provided to the patient.                        - Discharge patient to home.                        - Resume previous diet.                        - Continue present medications.                        - Await pathology results.                        - Repeat colonoscopy for surveillance based on                         pathology results.                        -  Return to referring physician as previously                         scheduled.                        - The findings and recommendations were discussed with                         the patient. Procedure Code(s):     --- Professional ---                        [phone], Colonoscopy, flexible; with biopsy, single or                         multiple Diagnosis Code(s):     --- Professional ---                        Z12.11, Encounter for screening for malignant neoplasm                         of colon                        D12.4, Benign neoplasm of descending colon                        D12.3, Benign neoplasm of transverse colon (hepatic                         flexure or splenic flexure)                        D12.2, Benign neoplasm of ascending colon CPT copyright 2022 American Medical Association. All rights reserved. The codes documented in this report are preliminary and upon coder review may  be revised to meet current compliance requirements. Attending Participation:      I personally performed the entire procedure. Elspeth Jungling, DO Elspeth Ozell Jungling DO, DO 11/16/2023 12:29:55 PM This report has been signed electronically. Number of Addenda: 0 Note Initiated On: 11/16/2023 11:51 AM Scope Withdrawal Time: 0 hours 13 minutes 17 seconds  Total Procedure Duration: 0 hours 17 minutes 19 seconds  Estimated Blood Loss:  Estimated blood loss was minimal.      [facility]

## 2023-11-16 NOTE — Anesthesia Postprocedure Evaluation (Signed)
 Anesthesia Post Note  Patient: Julian Murphy  Procedure(s) Performed: COLONOSCOPY WITH PROPOFOL  POLYPECTOMY  Patient location during evaluation: Endoscopy Anesthesia Type: General Level of consciousness: awake and alert Pain management: pain level controlled Vital Signs Assessment: post-procedure vital signs reviewed and stable Respiratory status: spontaneous breathing, nonlabored ventilation, respiratory function stable and patient connected to nasal cannula oxygen Cardiovascular status: blood pressure returned to baseline and stable Postop Assessment: no apparent nausea or vomiting Anesthetic complications: no   No notable events documented.   Last Vitals:  Vitals:   11/16/23 1240 11/16/23 1250  BP: (!) 128/91 (!) 133/92  Pulse:  71  Resp: 14 15  Temp:    SpO2: 100% 98%    Last Pain:  Vitals:   11/16/23 1250  TempSrc:   PainSc: 0-No pain                 Fairy POUR Elea Holtzclaw

## 2023-11-16 NOTE — Interval H&P Note (Signed)
 History and Physical Interval Note: Preprocedure H&P from 11/16/23  was reviewed and there was no interval change after seeing and examining the patient.  Written consent was obtained from the patient after discussion of risks, benefits, and alternatives. Patient has consented to proceed with Colonoscopy with possible intervention   11/16/2023 11:23 AM  Julian Murphy  has presented today for surgery, with the diagnosis of Z12.11 (ICD-10-CM) - Colon cancer screening.  The various methods of treatment have been discussed with the patient and family. After consideration of risks, benefits and other options for treatment, the patient has consented to  Procedure(s): COLONOSCOPY WITH PROPOFOL  (N/A) as a surgical intervention.  The patient's history has been reviewed, patient examined, no change in status, stable for surgery.  I have reviewed the patient's chart and labs.  Questions were answered to the patient's satisfaction.     Elspeth Ozell Jungling

## 2023-11-16 NOTE — Anesthesia Procedure Notes (Signed)
 Procedure Name: General with mask airway Date/Time: 11/16/2023 11:55 AM  Performed by: Ledora Duncan, CRNAPre-anesthesia Checklist: Patient identified, Emergency Drugs available, Suction available and Patient being monitored Patient Re-evaluated:Patient Re-evaluated prior to induction Oxygen Delivery Method: Simple face mask Induction Type: IV induction Placement Confirmation: positive ETCO2, CO2 detector and breath sounds checked- equal and bilateral Dental Injury: Teeth and Oropharynx as per pre-operative assessment

## 2023-11-16 NOTE — Transfer of Care (Signed)
 Immediate Anesthesia Transfer of Care Note  Patient: Julian Murphy  Procedure(s) Performed: COLONOSCOPY WITH PROPOFOL  POLYPECTOMY  Patient Location: Endoscopy Unit  Anesthesia Type:General  Level of Consciousness: drowsy and patient cooperative  Airway & Oxygen Therapy: Patient Spontanous Breathing and Patient connected to face mask oxygen  Post-op Assessment: Report given to RN and Post -op Vital signs reviewed and stable  Post vital signs: Reviewed and stable  Last Vitals:  Vitals Value Taken Time  BP 173/97 11/16/23 1219  Temp    Pulse 86 11/16/23 1220  Resp 31 11/16/23 1220  SpO2 99 % 11/16/23 1220  Vitals shown include unfiled device data.  Last Pain:  Vitals:   11/16/23 1120  TempSrc: Temporal  PainSc: 0-No pain         Complications: No notable events documented.

## 2023-11-16 NOTE — H&P (Signed)
 Pre-Procedure H&P   Patient ID: Julian Murphy is a 48 y.o. male.  Gastroenterology Provider: Elspeth Ozell Jungling, DO  Referring Provider: Dr. Lenon  PCP: [facility]  Date: 11/16/2023  HPI Julian Murphy is a 48 y.o. male who presents today for Colonoscopy for Colorectal cancer screening .  Reports regular bowel movements 1-2 times a day without melena or hematochezia.  No family history of colon cancer or colon polyps  Last colonoscopy in 2012 with benign findings   Past Medical History:  Diagnosis Date   Anxiety    Depression    Environmental and seasonal allergies    Hypertension    under control now    Past Surgical History:  Procedure Laterality Date   ESOPHAGOGASTRODUODENOSCOPY (EGD) WITH PROPOFOL  N/A 12/24/2021   Procedure: ESOPHAGOGASTRODUODENOSCOPY (EGD) WITH PROPOFOL ;  Surgeon: Maryruth Ole DASEN, MD;  Location: [facility] END[facility];  Service: Endoscopy;  Laterality: N/A;   SEPTOPLASTY Bilateral 10/13/2016   Procedure: SEPTOPLASTY;  Surgeon: Deward Argue, MD;  Location: [facility];  Service: ENT;  Laterality: Bilateral;   TURBINATE REDUCTION Bilateral 10/13/2016   Procedure: BILATERAL INFERIOR TURBINATE REDUCTION AND RIGHT MIDDLE;  Surgeon: Deward Argue, MD;  Location: [facility];  Service: ENT;  Laterality: Bilateral;   VASECTOMY      Family History No h/o GI disease or malignancy  Review of Systems  Constitutional:  Negative for activity change, appetite change, chills, diaphoresis, fatigue, fever and unexpected weight change.  HENT:  Negative for trouble swallowing and voice change.   Respiratory:  Negative for shortness of breath and wheezing.   Cardiovascular:  Negative for chest pain, palpitations and leg swelling.  Gastrointestinal:  Negative for abdominal distention, abdominal pain, anal bleeding, blood in stool, constipation, diarrhea, nausea and vomiting.  Musculoskeletal:  Negative for arthralgias and myalgias.   Skin:  Negative for color change and pallor.  Neurological:  Negative for dizziness, syncope and weakness.  Psychiatric/Behavioral:  Negative for confusion. The patient is not nervous/anxious.   All other systems reviewed and are negative.    Medications No current facility-administered medications on file prior to encounter.   Current Outpatient Medications on File Prior to Encounter  Medication Sig Dispense Refill   omeprazole (PRILOSEC OTC) 20 MG tablet Take 20 mg by mouth daily.     doxycycline  (VIBRAMYCIN ) 100 MG capsule Take 1 capsule (100 mg total) by mouth 2 (two) times daily. (Patient not taking: Reported on 12/24/2021) 14 capsule 0   losartan-hydrochlorothiazide (HYZAAR) 100-12.5 MG tablet Take 1 tablet by mouth daily.      Pertinent medications related to GI and procedure were reviewed by me with the patient prior to the procedure   Current Facility-Administered Medications:    0.9 %  sodium chloride  infusion, , Intravenous, Continuous, Jungling Elspeth Ozell, DO  sodium chloride          No Known Allergies Allergies were reviewed by me prior to the procedure  Objective   Body mass index is 37.6 kg/m. Vitals:   11/16/23 1120  BP: (!) 161/109  Pulse: 65  Resp: 16  Temp: (!) 97.3 F (36.3 C)  TempSrc: Temporal  SpO2: 98%  Weight: 129.3 kg  Height: 6' 1 (1.854 m)     Physical Exam Vitals and nursing note reviewed.  Constitutional:      General: He is not in acute distress.    Appearance: Normal appearance. He is not ill-appearing, toxic-appearing or diaphoretic.  HENT:     Head: Normocephalic and  atraumatic.     Nose: Nose normal.     Mouth/Throat:     Mouth: Mucous membranes are moist.     Pharynx: Oropharynx is clear.  Eyes:     General: No scleral icterus.    Extraocular Movements: Extraocular movements intact.  Cardiovascular:     Rate and Rhythm: Normal rate and regular rhythm.     Heart sounds: Normal heart sounds. No murmur heard.    No  friction rub. No gallop.  Pulmonary:     Effort: Pulmonary effort is normal. No respiratory distress.     Breath sounds: Normal breath sounds. No wheezing, rhonchi or rales.  Abdominal:     General: Bowel sounds are normal. There is no distension.     Palpations: Abdomen is soft.     Tenderness: There is no abdominal tenderness. There is no guarding or rebound.  Musculoskeletal:     Cervical back: Neck supple.     Right lower leg: No edema.     Left lower leg: No edema.  Skin:    General: Skin is warm and dry.     Coloration: Skin is not jaundiced or pale.  Neurological:     General: No focal deficit present.     Mental Status: He is alert and oriented to person, place, and time. Mental status is at baseline.  Psychiatric:        Mood and Affect: Mood normal.        Behavior: Behavior normal.        Thought Content: Thought content normal.        Judgment: Judgment normal.      Assessment:  Julian Murphy is a 48 y.o. male  who presents today for Colonoscopy for Colorectal cancer screening .  Plan:  Colonoscopy with possible intervention today  Colonoscopy with possible biopsy, control of bleeding, polypectomy, and interventions as necessary has been discussed with the patient/patient representative. Informed consent was obtained from the patient/patient representative after explaining the indication, nature, and risks of the procedure including but not limited to death, bleeding, perforation, missed neoplasm/lesions, cardiorespiratory compromise, and reaction to medications. Opportunity for questions was given and appropriate answers were provided. Patient/patient representative has verbalized understanding is amenable to undergoing the procedure.   Elspeth Ozell Jungling, DO  [facility]  Portions of the record may have been created with voice recognition software. Occasional wrong-word or 'sound-a-like' substitutions may have occurred due to the  inherent limitations of voice recognition software.  Read the chart carefully and recognize, using context, where substitutions may have occurred.

## 2023-11-17 ENCOUNTER — Encounter: Payer: Self-pay | Admitting: Gastroenterology

## 2023-11-17 LAB — SURGICAL PATHOLOGY
